# Patient Record
Sex: Male | Born: 1985 | Race: White | Hispanic: No | Marital: Married | Smoking: Current every day smoker
Health system: Southern US, Community
[De-identification: ages and names within clinical notes are randomized; demographics above are authoritative.]

## PROBLEM LIST (undated history)

## (undated) DIAGNOSIS — M5090 Cervical disc disorder, unspecified, unspecified cervical region: Secondary | ICD-10-CM

## (undated) HISTORY — PX: ADENOIDECTOMY: SUR15

## (undated) HISTORY — PX: TONSILLECTOMY AND ADENOIDECTOMY: SUR1326

## (undated) HISTORY — PX: TONSILLECTOMY: SUR1361

---

## 2008-05-01 ENCOUNTER — Emergency Department (HOSPITAL_BASED_OUTPATIENT_CLINIC_OR_DEPARTMENT_OTHER): Admission: EM | Admit: 2008-05-01 | Discharge: 2008-05-01 | Payer: Self-pay | Admitting: Emergency Medicine

## 2011-04-28 LAB — CBC
MCHC: 34
Platelets: 306
RDW: 12.4
WBC: 9.5

## 2011-04-28 LAB — BASIC METABOLIC PANEL
BUN: 10
CO2: 25
Calcium: 9
GFR calc non Af Amer: >60
Sodium: 143

## 2011-04-28 LAB — POCT TOXICOLOGY PANEL

## 2011-04-28 LAB — DIFFERENTIAL
Basophils Relative: 1
Eosinophils Absolute: 0.4
Eosinophils Relative: 5
Lymphocytes Relative: 32
Monocytes Relative: 6
Neutrophils Relative %: 56

## 2011-04-28 LAB — URINALYSIS, ROUTINE W REFLEX MICROSCOPIC
Glucose, UA: NEGATIVE
Nitrite: NEGATIVE
Protein, ur: NEGATIVE
Specific Gravity, Urine: 1.006
Urobilinogen, UA: 0.2
pH: 6

## 2011-11-28 ENCOUNTER — Encounter (HOSPITAL_BASED_OUTPATIENT_CLINIC_OR_DEPARTMENT_OTHER): Payer: Self-pay | Admitting: *Deleted

## 2011-11-28 ENCOUNTER — Emergency Department (INDEPENDENT_AMBULATORY_CARE_PROVIDER_SITE_OTHER): Payer: Self-pay

## 2011-11-28 ENCOUNTER — Emergency Department (HOSPITAL_BASED_OUTPATIENT_CLINIC_OR_DEPARTMENT_OTHER)
Admission: EM | Admit: 2011-11-28 | Discharge: 2011-11-28 | Disposition: A | Discharge status: Home / Self Care | Payer: Self-pay | Attending: Emergency Medicine | Admitting: Emergency Medicine

## 2011-11-28 DIAGNOSIS — S0100XA Unspecified open wound of scalp, initial encounter: Secondary | ICD-10-CM | POA: Insufficient documentation

## 2011-11-28 DIAGNOSIS — S0180XA Unspecified open wound of other part of head, initial encounter: Secondary | ICD-10-CM

## 2011-11-28 DIAGNOSIS — R51 Headache: Secondary | ICD-10-CM

## 2011-11-28 DIAGNOSIS — S0101XA Laceration without foreign body of scalp, initial encounter: Secondary | ICD-10-CM

## 2011-11-28 DIAGNOSIS — S01309A Unspecified open wound of unspecified ear, initial encounter: Secondary | ICD-10-CM

## 2011-11-28 DIAGNOSIS — Y9229 Other specified public building as the place of occurrence of the external cause: Secondary | ICD-10-CM | POA: Insufficient documentation

## 2011-11-28 DIAGNOSIS — S0990XA Unspecified injury of head, initial encounter: Secondary | ICD-10-CM | POA: Insufficient documentation

## 2011-11-28 DIAGNOSIS — S01312A Laceration without foreign body of left ear, initial encounter: Secondary | ICD-10-CM

## 2011-11-28 MED ORDER — LIDOCAINE HCL 2 % IJ SOLN
20.0000 mL | Freq: Once | INTRAMUSCULAR | Status: AC
  Administered 2011-11-28: 400 mg via INTRADERMAL

## 2011-11-28 MED ORDER — LIDOCAINE HCL 2 % IJ SOLN
INTRAMUSCULAR | Status: AC
  Filled 2011-11-28: qty 1

## 2011-11-28 MED ORDER — LIDOCAINE-EPINEPHRINE 2 %-1:100000 IJ SOLN
INTRAMUSCULAR | Status: AC
  Filled 2011-11-28: qty 1
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
## 2011-11-28 NOTE — ED Notes (Signed)
Dr. Bednar at bedside. 

## 2012-05-25 ENCOUNTER — Emergency Department (HOSPITAL_BASED_OUTPATIENT_CLINIC_OR_DEPARTMENT_OTHER)
Admission: EM | Admit: 2012-05-25 | Discharge: 2012-05-25 | Disposition: A | Discharge status: Home / Self Care | Payer: No Typology Code available for payment source | Attending: Emergency Medicine | Admitting: Emergency Medicine

## 2012-05-25 ENCOUNTER — Encounter (HOSPITAL_BASED_OUTPATIENT_CLINIC_OR_DEPARTMENT_OTHER): Payer: Self-pay | Admitting: *Deleted

## 2012-05-25 ENCOUNTER — Emergency Department (HOSPITAL_BASED_OUTPATIENT_CLINIC_OR_DEPARTMENT_OTHER): Payer: No Typology Code available for payment source

## 2012-05-25 DIAGNOSIS — Y9389 Activity, other specified: Secondary | ICD-10-CM | POA: Insufficient documentation

## 2012-05-25 DIAGNOSIS — IMO0002 Reserved for concepts with insufficient information to code with codable children: Secondary | ICD-10-CM | POA: Insufficient documentation

## 2012-05-25 DIAGNOSIS — Y9289 Other specified places as the place of occurrence of the external cause: Secondary | ICD-10-CM | POA: Insufficient documentation

## 2012-05-25 DIAGNOSIS — S46919A Strain of unspecified muscle, fascia and tendon at shoulder and upper arm level, unspecified arm, initial encounter: Secondary | ICD-10-CM

## 2012-05-25 DIAGNOSIS — F17200 Nicotine dependence, unspecified, uncomplicated: Secondary | ICD-10-CM | POA: Insufficient documentation

## 2012-05-25 MED ORDER — HYDROCODONE-ACETAMINOPHEN 5-500 MG PO TABS: 1.0000 | ORAL_TABLET | Freq: Three times a day (TID) | ORAL | Status: DC | PRN
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
## 2012-05-25 NOTE — ED Notes (Signed)
Patient transported to X-ray 
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
## 2014-01-01 ENCOUNTER — Emergency Department (HOSPITAL_BASED_OUTPATIENT_CLINIC_OR_DEPARTMENT_OTHER)
Admission: EM | Admit: 2014-01-01 | Discharge: 2014-01-01 | Disposition: A | Discharge status: Home / Self Care | Payer: No Typology Code available for payment source | Attending: Emergency Medicine | Admitting: Emergency Medicine

## 2014-01-01 ENCOUNTER — Encounter (HOSPITAL_BASED_OUTPATIENT_CLINIC_OR_DEPARTMENT_OTHER): Payer: Self-pay | Admitting: Emergency Medicine

## 2014-01-01 DIAGNOSIS — F17200 Nicotine dependence, unspecified, uncomplicated: Secondary | ICD-10-CM | POA: Insufficient documentation

## 2014-01-01 DIAGNOSIS — K029 Dental caries, unspecified: Secondary | ICD-10-CM

## 2014-01-01 DIAGNOSIS — K089 Disorder of teeth and supporting structures, unspecified: Secondary | ICD-10-CM | POA: Insufficient documentation

## 2014-01-01 DIAGNOSIS — K0381 Cracked tooth: Secondary | ICD-10-CM | POA: Insufficient documentation

## 2014-01-01 DIAGNOSIS — S025XXA Fracture of tooth (traumatic), initial encounter for closed fracture: Secondary | ICD-10-CM

## 2014-01-01 MED ORDER — IBUPROFEN 800 MG PO TABS: 800.0000 mg | ORAL_TABLET | Freq: Three times a day (TID) | ORAL | Status: DC

## 2014-01-01 MED ORDER — AMOXICILLIN 500 MG PO CAPS: 500.0000 mg | ORAL_CAPSULE | Freq: Three times a day (TID) | ORAL | Status: DC

## 2014-01-01 MED ORDER — HYDROCODONE-ACETAMINOPHEN 5-325 MG PO TABS: 1.0000 | ORAL_TABLET | Freq: Once | ORAL | Status: DC

## 2014-01-01 MED ORDER — HYDROCODONE-ACETAMINOPHEN 5-325 MG PO TABS: 2.0000 | ORAL_TABLET | ORAL | Status: DC | PRN

## 2014-01-01 MED ORDER — AMOXICILLIN 500 MG PO CAPS: 500.0000 mg | ORAL_CAPSULE | Freq: Once | ORAL | Status: DC

## 2014-01-01 NOTE — ED Provider Notes (Signed)
CSN: [id number]     Arrival date & time 01/01/14  1619 History   First MD Initiated Contact with Patient 01/01/14 1744     Chief Complaint  Patient presents with  . Dental Pain     (Consider location/radiation/quality/duration/timing/severity/associated sxs/prior Treatment) Patient is a 28 y.o. male presenting with tooth pain. The history is provided by the patient.  Dental Pain Location:  Lower Lower teeth location:  31/RL 2nd molar Quality:  Throbbing and constant Severity:  Severe Onset quality:  Gradual Duration:  3 days Timing:  Constant Progression:  Worsening Chronicity:  New Context: dental caries and dental fracture   Relieved by:  Nothing Worsened by:  Pressure, jaw movement and cold food/drink Ineffective treatments:  Acetaminophen  Yehuda SavannahJeffrey Natt is a 28 y.o. male who presents to the ED with dental pain. The pain is located in the lower right second molar. The tooth was decayed and while eating 3 days ago the tooth broke.   History reviewed. No pertinent past medical history. Past Surgical History  Procedure Laterality Date  . Tonsillectomy    . Adenoidectomy     No family history on file. History  Substance Use Topics  . Smoking status: Current Every Day Smoker -- 0.50 packs/day  . Smokeless tobacco: Not on file  . Alcohol Use: No    Review of Systems Negative except as stated in HPI   Allergies  Septra  Home Medications   Prior to Admission medications   Medication Sig Start Date End Date Taking? Authorizing Provider  HYDROcodone-acetaminophen (VICODIN) 5-500 MG per tablet Take 1 tablet by mouth every 8 (eight) hours as needed for pain. 05/25/12   Joya Gaskinsonald W Wickline, MD   BP 121/75  Pulse 115  Temp(Src) 98.3 F (36.8 C) (Oral)  Resp 20  Ht 6' (1.829 m)  Wt 175 lb (79.379 kg)  BMI 23.73 kg/m2  SpO2 93% Physical Exam  Nursing note and vitals reviewed. Constitutional: He is oriented to person, place, and time. He appears well-developed and  well-nourished. No distress.  HENT:  Head: Normocephalic.  Mouth/Throat: Uvula is midline, oropharynx is clear and moist and mucous membranes are normal.    Right lower second molar is broken and decayed at the gum line. Tender on palpation.   Eyes: EOM are normal.  Neck: Neck supple.  Pulmonary/Chest: Effort normal.  Abdominal: Soft. There is no tenderness.  Musculoskeletal: Normal range of motion.  Neurological: He is alert and oriented to person, place, and time. No cranial nerve deficit.  Skin: Skin is warm and dry.    ED Course  Procedures ( MDM  28 y.o. male with dental pain after his tooth broke 3 days ago. The tooth is decayed. Will treat for infection and pain. He will follow up with oral surgery. He will return here as needed.  Discussed with the patient and all questioned fully answered.    Medication List    STOP taking these medications       HYDROcodone-acetaminophen 5-500 MG per tablet  Commonly known as:  VICODIN  Replaced by:  HYDROcodone-acetaminophen 5-325 MG per tablet      TAKE these medications       amoxicillin 500 MG capsule  Commonly known as:  AMOXIL  Take 1 capsule (500 mg total) by mouth 3 (three) times daily.     HYDROcodone-acetaminophen 5-325 MG per tablet  Commonly known as:  NORCO/VICODIN  Take 2 tablets by mouth every 4 (four) hours as needed.  ibuprofen 800 MG tablet  Commonly known as:  ADVIL,MOTRIN  Take 1 tablet (800 mg total) by mouth 3 (three) times daily.          [facility] Orlene Och, [place] 01/02/14 0127

## 2014-01-01 NOTE — ED Notes (Signed)
Dental pain x 3 days. His tooth broke.

## 2014-01-04 NOTE — ED Provider Notes (Signed)
Medical screening examination/treatment/procedure(s) were performed by non-physician practitioner and as supervising physician I was immediately available for consultation/collaboration.   EKG Interpretation None        Layla Maw Ward, DO 01/04/14 0383

## 2014-08-02 ENCOUNTER — Emergency Department (HOSPITAL_BASED_OUTPATIENT_CLINIC_OR_DEPARTMENT_OTHER): Payer: Self-pay

## 2014-08-02 ENCOUNTER — Encounter (HOSPITAL_BASED_OUTPATIENT_CLINIC_OR_DEPARTMENT_OTHER): Payer: Self-pay

## 2014-08-02 ENCOUNTER — Emergency Department (HOSPITAL_BASED_OUTPATIENT_CLINIC_OR_DEPARTMENT_OTHER)
Admission: EM | Admit: 2014-08-02 | Discharge: 2014-08-03 | Disposition: A | Discharge status: Home / Self Care | Payer: Self-pay | Attending: Emergency Medicine | Admitting: Emergency Medicine

## 2014-08-02 DIAGNOSIS — R05 Cough: Secondary | ICD-10-CM

## 2014-08-02 DIAGNOSIS — Z720 Tobacco use: Secondary | ICD-10-CM | POA: Insufficient documentation

## 2014-08-02 DIAGNOSIS — J069 Acute upper respiratory infection, unspecified: Secondary | ICD-10-CM | POA: Insufficient documentation

## 2014-08-02 DIAGNOSIS — J209 Acute bronchitis, unspecified: Secondary | ICD-10-CM | POA: Insufficient documentation

## 2014-08-02 DIAGNOSIS — J40 Bronchitis, not specified as acute or chronic: Secondary | ICD-10-CM

## 2014-08-02 DIAGNOSIS — R059 Cough, unspecified: Secondary | ICD-10-CM

## 2014-08-02 DIAGNOSIS — M791 Myalgia: Secondary | ICD-10-CM | POA: Insufficient documentation

## 2014-08-02 MED ORDER — SODIUM CHLORIDE 0.9 % IV BOLUS (SEPSIS)
1000.0000 mL | Freq: Once | INTRAVENOUS | Status: AC
  Administered 2014-08-02: 1000 mL via INTRAVENOUS

## 2014-08-02 MED ORDER — KETOROLAC TROMETHAMINE 30 MG/ML IJ SOLN
30.0000 mg | Freq: Once | INTRAMUSCULAR | Status: AC
  Administered 2014-08-02: 30 mg via INTRAVENOUS
  Filled 2014-08-02: qty 1

## 2014-08-02 NOTE — ED Provider Notes (Signed)
CSN: [id number]     Arrival date & time 08/02/14  2226 History  This chart was scribed for Shakiyla Kook Smitty CordsK Asaf Elmquist-Rasch, MD by Roxy Cedarhandni Bhalodia, ED Scribe. This patient was seen in room MH11/MH11 and the patient's care was started at 11:18 PM.   Chief Complaint  Patient presents with  . URI   Patient is a 29 y.o. male presenting with URI. The history is provided by the patient. No language interpreter was used.  URI Presenting symptoms: congestion, cough and rhinorrhea   Presenting symptoms: no fever and no sore throat   Severity:  Moderate Onset quality:  Gradual Duration:  1 week Timing:  Constant Progression:  Worsening Chronicity:  New Relieved by:  Nothing Worsened by:  Nothing tried Ineffective treatments:  None tried Associated symptoms: myalgias   Associated symptoms: no headaches and no neck pain    HPI Comments: Dale Hill is a 29 y.o. male who presents to the Emergency Department complaining of productive cough with yellow and green sputum, body aches, nasal congestion, wheezing, rhinorrhea for 7 days. Patient states that he has been taking Mucinex. He reports intermittent episodes of diarrhea. States he travel by plan to [place] in October but denies leg or chest pain. Patient is a smoker. Patient states he ran out of his inhaler. Patient states that the cough worsened in the past 2 days.  No fevers no rashes on the skin.    History reviewed. No pertinent past medical history. Past Surgical History  Procedure Laterality Date  . Tonsillectomy    . Adenoidectomy     No family history on file. History  Substance Use Topics  . Smoking status: Current Every Day Smoker -- 0.00 packs/day  . Smokeless tobacco: Not on file  . Alcohol Use: Yes   Review of Systems  Constitutional: Negative for fever.  HENT: Positive for congestion and rhinorrhea. Negative for sore throat and trouble swallowing.   Eyes: Negative for photophobia.  Respiratory: Positive for cough.   Musculoskeletal:  Positive for myalgias. Negative for neck pain and neck stiffness.  Neurological: Negative for headaches.  All other systems reviewed and are negative.  Allergies  Septra  Home Medications   Prior to Admission medications   Medication Sig Start Date End Date Taking? Authorizing Provider  Pseudoephedrine-Guaifenesin [facility](MUCINEX D PO) Take by mouth.   Yes Historical Provider, MD   Triage Vitals: BP 133/85 mmHg  Pulse 134  Temp(Src) 98.3 F (36.8 C) (Oral)  Resp 18  Ht 6' (1.829 m)  Wt 190 lb (86.183 kg)  BMI 25.76 kg/m2  SpO2 94%  Physical Exam  Constitutional: He is oriented to person, place, and time. He appears well-developed and well-nourished. No distress.     HENT:  Head: Normocephalic and atraumatic.  Right Ear: External ear normal.  Left Ear: External ear normal.  Mouth/Throat: Oropharynx is clear and moist.  Eyes: Conjunctivae and EOM are normal. Pupils are equal, round, and reactive to light.  Neck: Normal range of motion. Neck supple.  No meningismus  Cardiovascular: Normal rate, regular rhythm and normal heart sounds.   Pulmonary/Chest: Effort normal and breath sounds normal. No respiratory distress. He has no wheezes. He has no rales.  Abdominal: Soft. Bowel sounds are normal. There is no tenderness. There is no rebound and no guarding.  Musculoskeletal: Normal range of motion. He exhibits no edema or tenderness.  Lymphadenopathy:    He has no cervical adenopathy.  Neurological: He is alert and oriented to person, place, and time. He  has normal reflexes. No cranial nerve deficit. He exhibits normal muscle tone. Coordination normal.  Skin: Skin is warm and dry. No rash noted.  Psychiatric: He has a normal mood and affect. His behavior is normal.  Nursing note and vitals reviewed.  ED Course  Procedures (including critical care time)  DIAGNOSTIC STUDIES: Oxygen Saturation is 94% on RA, normal by my interpretation.    COORDINATION OF CARE: 11:51 PM- Discussed  plans to order diagnostic CXR, lab work and urinalysis. Will give patient IV fluids and Toradol. Pt advised of plan for treatment and pt agrees.  Labs Review Labs Reviewed  RESPIRATORY VIRUS PANEL  URINALYSIS, ROUTINE W REFLEX MICROSCOPIC   Imaging Review No results found.   EKG Interpretation None     MDM   Final diagnoses:  None    Given persistent tachycardia without fever and recent travel ruled out for PE. Patient denied all substances but urine is positive for THC and amphetamines.  On previous visits patient HR never < 100.  There is no neck stiffness no fever and symptoms > 7 days.  No indication for advanced work up or LP.  Will treat with naproxen and doxycycline for bronchitis as patient is a smoker.  Stop all substances and follow up with your PMD for recheck  I personally performed the services described in this documentation, which was scribed in my presence. The recorded information has been reviewed and is accurate.  Jasmine Awe, MD 08/03/14 [phone]

## 2014-08-02 NOTE — ED Notes (Signed)
Pt states he's been coughing up large amounts of sputum. Has stiffness in the neck. Pt is using a tennis ball to massage a knot out of his neck/back. Pt a/o x4

## 2014-08-02 NOTE — ED Notes (Signed)
C/o head/chest congestion x 1 week

## 2014-08-03 ENCOUNTER — Encounter (HOSPITAL_BASED_OUTPATIENT_CLINIC_OR_DEPARTMENT_OTHER): Payer: Self-pay

## 2014-08-03 ENCOUNTER — Emergency Department (HOSPITAL_BASED_OUTPATIENT_CLINIC_OR_DEPARTMENT_OTHER): Payer: Self-pay

## 2014-08-03 LAB — URINALYSIS, ROUTINE W REFLEX MICROSCOPIC
Bilirubin Urine: NEGATIVE
Glucose, UA: NEGATIVE mg/dL
Hgb urine dipstick: NEGATIVE
Ketones, ur: NEGATIVE mg/dL
LEUKOCYTES UA: NEGATIVE
Nitrite: NEGATIVE
PH: 7.5 (ref 5.0–8.0)
Protein, ur: NEGATIVE mg/dL
SPECIFIC GRAVITY, URINE: 1.008 (ref 1.005–1.030)
UROBILINOGEN UA: 0.2 mg/dL (ref 0.0–1.0)

## 2014-08-03 LAB — RAPID URINE DRUG SCREEN, HOSP PERFORMED
AMPHETAMINES: POSITIVE — AB
BENZODIAZEPINES: NOT DETECTED
Barbiturates: NOT DETECTED
COCAINE: NOT DETECTED
Opiates: NOT DETECTED
Tetrahydrocannabinol: POSITIVE — AB

## 2014-08-03 LAB — BASIC METABOLIC PANEL
ANION GAP: 8 (ref 5–15)
BUN: 7 mg/dL (ref 6–23)
CHLORIDE: 101 meq/L (ref 96–112)
CO2: 29 mmol/L (ref 19–32)
CREATININE: 0.84 mg/dL (ref 0.50–1.35)
Calcium: 9.3 mg/dL (ref 8.4–10.5)
GFR calc Af Amer: >90 mL/min (ref >90)
GFR calc non Af Amer: >90 mL/min (ref >90)
Glucose, Bld: 121 mg/dL — ABNORMAL HIGH (ref 70–99)
POTASSIUM: 3.5 mmol/L (ref 3.5–5.1)
Sodium: 138 mmol/L (ref 135–145)

## 2014-08-03 LAB — CBC WITH DIFFERENTIAL/PLATELET
BASOS ABS: 0 10*3/uL (ref 0.0–0.1)
BASOS PCT: 1 % (ref 0–1)
EOS ABS: 0.3 10*3/uL (ref 0.0–0.7)
EOS PCT: 4 % (ref 0–5)
HCT: 48.6 % (ref 39.0–52.0)
HEMOGLOBIN: 16.6 g/dL (ref 13.0–17.0)
Lymphocytes Relative: 20 % (ref 12–46)
Lymphs Abs: 1.7 10*3/uL (ref 0.7–4.0)
MCH: 31.1 pg (ref 26.0–34.0)
MCHC: 34.2 g/dL (ref 30.0–36.0)
MCV: 91.2 fL (ref 78.0–100.0)
MONOS PCT: 6 % (ref 3–12)
Monocytes Absolute: 0.5 10*3/uL (ref 0.1–1.0)
NEUTROS ABS: 6 10*3/uL (ref 1.7–7.7)
Neutrophils Relative %: 71 % (ref 43–77)
Platelets: 282 10*3/uL (ref 150–400)
RBC: 5.33 MIL/uL (ref 4.22–5.81)
RDW: 12.3 % (ref 11.5–15.5)
WBC: 8.6 10*3/uL (ref 4.0–10.5)

## 2014-08-03 MED ORDER — DOXYCYCLINE HYCLATE 100 MG PO CAPS: 100.0000 mg | ORAL_CAPSULE | Freq: Two times a day (BID) | ORAL | Status: DC

## 2014-08-03 MED ORDER — SODIUM CHLORIDE 0.9 % IV BOLUS (SEPSIS)
1000.0000 mL | Freq: Once | INTRAVENOUS | Status: AC
  Administered 2014-08-03: 1000 mL via INTRAVENOUS

## 2014-08-03 MED ORDER — IOHEXOL 350 MG/ML SOLN
100.0000 mL | Freq: Once | INTRAVENOUS | Status: AC | PRN
  Administered 2014-08-03: 100 mL via INTRAVENOUS

## 2014-08-03 MED ORDER — DOXYCYCLINE HYCLATE 100 MG PO TABS
100.0000 mg | ORAL_TABLET | Freq: Once | ORAL | Status: AC
  Administered 2014-08-03: 100 mg via ORAL
  Filled 2014-08-03: qty 1

## 2014-08-03 MED ORDER — ALBUTEROL SULFATE HFA 108 (90 BASE) MCG/ACT IN AERS: 1.0000 | INHALATION_SPRAY | Freq: Four times a day (QID) | RESPIRATORY_TRACT | Status: DC | PRN

## 2014-08-03 NOTE — ED Notes (Signed)
Patient transported to X-ray 

## 2014-12-08 ENCOUNTER — Emergency Department (HOSPITAL_BASED_OUTPATIENT_CLINIC_OR_DEPARTMENT_OTHER)
Admission: EM | Admit: 2014-12-08 | Discharge: 2014-12-08 | Disposition: A | Discharge status: Home / Self Care | Payer: Self-pay | Attending: Emergency Medicine | Admitting: Emergency Medicine

## 2014-12-08 ENCOUNTER — Encounter (HOSPITAL_BASED_OUTPATIENT_CLINIC_OR_DEPARTMENT_OTHER): Payer: Self-pay

## 2014-12-08 DIAGNOSIS — Z720 Tobacco use: Secondary | ICD-10-CM | POA: Insufficient documentation

## 2014-12-08 DIAGNOSIS — L03311 Cellulitis of abdominal wall: Secondary | ICD-10-CM | POA: Insufficient documentation

## 2014-12-08 DIAGNOSIS — Z792 Long term (current) use of antibiotics: Secondary | ICD-10-CM | POA: Insufficient documentation

## 2014-12-08 DIAGNOSIS — L03116 Cellulitis of left lower limb: Secondary | ICD-10-CM | POA: Insufficient documentation

## 2014-12-08 DIAGNOSIS — L03818 Cellulitis of other sites: Secondary | ICD-10-CM

## 2014-12-08 MED ORDER — CLINDAMYCIN HCL 150 MG PO CAPS
300.0000 mg | ORAL_CAPSULE | Freq: Once | ORAL | Status: AC
  Administered 2014-12-08: 300 mg via ORAL
  Filled 2014-12-08: qty 2

## 2014-12-08 MED ORDER — CLINDAMYCIN HCL 300 MG PO CAPS: 300.0000 mg | ORAL_CAPSULE | Freq: Three times a day (TID) | ORAL | Status: DC

## 2014-12-08 NOTE — Discharge Instructions (Signed)
You have a skin infection, likely MRSA.  Take clinda 300 mg three times daily for a week.   Follow up with your doctor.   Return to ER if you have fever, worse rash, purulent drainage from the wound.

## 2014-12-08 NOTE — ED Provider Notes (Signed)
CSN: [id number]     Arrival date & time 12/08/14  1439 History  This chart was scribed for Dale Canalavid H Yao, MD by Abel PrestoKara Demonbreun, ED Scribe. This patient was seen in room MH03/MH03 and the patient's care was started at 3:42 PM.    Chief Complaint  Patient presents with  . Insect Bite     The history is provided by the patient. No language interpreter was used.   HPI Comments: Dale Hill is a 29 y.o. male otherwise healthy who presents to the Emergency Department complaining of 2 areas of redness to left shin and left abdomen. Pt states he as working in yard at onset. He is unsure if he was bitten by and insect. Pt notes associated tenderness and mild drainage. Pt denies h/o abscess, rash, and MRSA. No fever but brother has hx of MRSA. Doesn't use IV drugs. Pt is allergic to Septra. Pt denies rash in any other areas, fever, nausea, vomiting, and itching.   History reviewed. No pertinent past medical history. Past Surgical History  Procedure Laterality Date  . Tonsillectomy    . Adenoidectomy     No family history on file. History  Substance Use Topics  . Smoking status: Current Every Day Smoker -- 0.00 packs/day    Types: Cigarettes  . Smokeless tobacco: Not on file  . Alcohol Use: Yes    Review of Systems  Constitutional: Negative for fever.  Gastrointestinal: Negative for nausea and vomiting.  Skin: Positive for rash.     Allergies  Septra  Home Medications   Prior to Admission medications   Medication Sig Start Date End Date Taking? Authorizing Provider  albuterol (PROVENTIL HFA;VENTOLIN HFA) 108 (90 BASE) MCG/ACT inhaler Inhale 1-2 puffs into the lungs every 6 (six) hours as needed for wheezing or shortness of breath. 08/03/14   April Palumbo, MD  doxycycline (VIBRAMYCIN) 100 MG capsule Take 1 capsule (100 mg total) by mouth 2 (two) times daily. One po bid x 7 days 08/03/14   April Palumbo, MD  Pseudoephedrine-Guaifenesin [facility](MUCINEX D PO) Take by mouth.    Historical  Provider, MD   BP 127/77 mmHg  Temp(Src) 98.4 F (36.9 C) (Oral)  Resp 18  Ht 6' (1.829 m)  Wt 175 lb (79.379 kg)  BMI 23.73 kg/m2  SpO2 98% Physical Exam  Constitutional: He is oriented to person, place, and time. He appears well-developed and well-nourished.  HENT:  Head: Normocephalic and atraumatic.  Eyes: Conjunctivae and EOM are normal.  Neck: Normal range of motion. Neck supple.  Cardiovascular: Normal rate, regular rhythm, normal heart sounds and intact distal pulses.   Pulmonary/Chest: Effort normal and breath sounds normal. No respiratory distress.  Abdominal: Soft. He exhibits no distension. There is no tenderness.  Musculoskeletal: Normal range of motion.  Neurological: He is alert and oriented to person, place, and time.  Skin: Skin is warm and dry.  Cellulitis on abdomen and L shin, no evidence of abscess or purulent discharge.   Psychiatric: He has a normal mood and affect. His behavior is normal. Judgment normal.  Nursing note and vitals reviewed.   ED Course  Procedures (including critical care time) DIAGNOSTIC STUDIES: Oxygen Saturation is 98% on room air, normal by my interpretation.    COORDINATION OF CARE: 3:45 PM Discussed treatment plan with patient at beside, the patient agrees with the plan and has no further questions at this time.   Labs Review Labs Reviewed - No data to display  Imaging Review No results found.  EKG Interpretation None      MDM   Final diagnoses:  None   Dale Hill is a 29 y.o. male here with rash. Likely cellulitis, no evidence of abscess. Consider MRSA. Allergic to bactrim. Will dc on clinda.   I personally performed the services described in this documentation, which was scribed in my presence. The recorded information has been reviewed and is accurate.   Dale Canalavid H Yao, MD 12/08/14 [phone]

## 2014-12-08 NOTE — ED Notes (Signed)
Pt reports was outside landscaping yesterday and got bitten in L anterior shin and abdomen, ?bites with erythema surrounding on each.

## 2014-12-08 NOTE — ED Notes (Signed)
No other areas noted on body, pt also denies any other areas as well.

## 2014-12-08 NOTE — ED Notes (Signed)
Presents with two red areas, first site: on left ant leg, tibial area, noted to be slightly swollen with sm amt of drainage. Second site: on abd, left of umbilicus, area also noted to be red, min amt of drainage noted at this site as well. Pt states has pain upon touching sites. Denies any fever, nausea or vomiting.

## 2015-02-01 ENCOUNTER — Emergency Department (HOSPITAL_BASED_OUTPATIENT_CLINIC_OR_DEPARTMENT_OTHER)
Admission: EM | Admit: 2015-02-01 | Discharge: 2015-02-01 | Disposition: A | Discharge status: Home / Self Care | Payer: Self-pay | Attending: Emergency Medicine | Admitting: Emergency Medicine

## 2015-02-01 ENCOUNTER — Encounter (HOSPITAL_BASED_OUTPATIENT_CLINIC_OR_DEPARTMENT_OTHER): Payer: Self-pay

## 2015-02-01 ENCOUNTER — Emergency Department (HOSPITAL_BASED_OUTPATIENT_CLINIC_OR_DEPARTMENT_OTHER): Payer: Self-pay

## 2015-02-01 DIAGNOSIS — Z720 Tobacco use: Secondary | ICD-10-CM | POA: Insufficient documentation

## 2015-02-01 DIAGNOSIS — R52 Pain, unspecified: Secondary | ICD-10-CM

## 2015-02-01 DIAGNOSIS — N492 Inflammatory disorders of scrotum: Secondary | ICD-10-CM | POA: Insufficient documentation

## 2015-02-01 LAB — BASIC METABOLIC PANEL
ANION GAP: 11 (ref 5–15)
BUN: 18 mg/dL (ref 6–20)
CO2: 27 mmol/L (ref 22–32)
CREATININE: 1.09 mg/dL (ref 0.61–1.24)
Calcium: 9.6 mg/dL (ref 8.9–10.3)
Chloride: 100 mmol/L — ABNORMAL LOW (ref 101–111)
GFR calc Af Amer: >60 mL/min (ref >60)
GFR calc non Af Amer: >60 mL/min (ref >60)
Glucose, Bld: 126 mg/dL — ABNORMAL HIGH (ref 65–99)
POTASSIUM: 3.1 mmol/L — AB (ref 3.5–5.1)
SODIUM: 138 mmol/L (ref 135–145)

## 2015-02-01 LAB — URINALYSIS, ROUTINE W REFLEX MICROSCOPIC
GLUCOSE, UA: NEGATIVE mg/dL
HGB URINE DIPSTICK: NEGATIVE
KETONES UR: 15 mg/dL — AB
Leukocytes, UA: NEGATIVE
Nitrite: NEGATIVE
Protein, ur: NEGATIVE mg/dL
Specific Gravity, Urine: 1.041 — ABNORMAL HIGH (ref 1.005–1.030)
Urobilinogen, UA: 1 mg/dL (ref 0.0–1.0)
pH: 5.5 (ref 5.0–8.0)

## 2015-02-01 LAB — CBC
HCT: 45.4 % (ref 39.0–52.0)
Hemoglobin: 15.3 g/dL (ref 13.0–17.0)
MCH: 30.5 pg (ref 26.0–34.0)
MCHC: 33.7 g/dL (ref 30.0–36.0)
MCV: 90.4 fL (ref 78.0–100.0)
PLATELETS: 246 10*3/uL (ref 150–400)
RBC: 5.02 MIL/uL (ref 4.22–5.81)
RDW: 13 % (ref 11.5–15.5)
WBC: 12.2 10*3/uL — ABNORMAL HIGH (ref 4.0–10.5)

## 2015-02-01 MED ORDER — HYDROCODONE-ACETAMINOPHEN 5-325 MG PO TABS
2.0000 | ORAL_TABLET | Freq: Once | ORAL | Status: AC
  Administered 2015-02-01: 2 via ORAL
  Filled 2015-02-01: qty 2

## 2015-02-01 MED ORDER — DOXYCYCLINE HYCLATE 100 MG PO CAPS: 100.0000 mg | ORAL_CAPSULE | Freq: Two times a day (BID) | ORAL | Status: DC

## 2015-02-01 NOTE — Discharge Instructions (Signed)
Cellulitis °Cellulitis is an infection of the skin and the tissue beneath it. The infected area is usually red and tender. Cellulitis occurs most often in the arms and lower legs.  °CAUSES  °Cellulitis is caused by bacteria that enter the skin through cracks or cuts in the skin. The most common types of bacteria that cause cellulitis are staphylococci and streptococci. °SIGNS AND SYMPTOMS  °· Redness and warmth. °· Swelling. °· Tenderness or pain. °· Fever. °DIAGNOSIS  °Your health care provider can usually determine what is wrong based on a physical exam. Blood tests may also be done. °TREATMENT  °Treatment usually involves taking an antibiotic medicine. °HOME CARE INSTRUCTIONS  °· Take your antibiotic medicine as directed by your health care provider. Finish the antibiotic even if you start to feel better. °· Keep the infected arm or leg elevated to reduce swelling. °· Apply a warm cloth to the affected area up to 4 times per day to relieve pain. °· Take medicines only as directed by your health care provider. °· Keep all follow-up visits as directed by your health care provider. °SEEK MEDICAL CARE IF:  °· You notice red streaks coming from the infected area. °· Your red area gets larger or turns dark in color. °· Your bone or joint underneath the infected area becomes painful after the skin has healed. °· Your infection returns in the same area or another area. °· You notice a swollen bump in the infected area. °· You develop new symptoms. °· You have a fever. °SEEK IMMEDIATE MEDICAL CARE IF:  °· You feel very sleepy. °· You develop vomiting or diarrhea. °· You have a general ill feeling (malaise) with muscle aches and pains. °MAKE SURE YOU:  °· Understand these instructions. °· Will watch your condition. °· Will get help right away if you are not doing well or get worse. °Document Released: 04/22/2005 Document Revised: 11/27/2013 Document Reviewed: 09/28/2011 °ExitCare® Patient Information ©2015 [employer].  This information is not intended to replace advice given to you by your health care provider. Make sure you discuss any questions you have with your health care provider. ° ° °Emergency Department Resource Guide °1) Find a Doctor and Pay Out of Pocket °Although you won't have to find out who is covered by your insurance plan, it is a good idea to ask around and get recommendations. You will then need to call the office and see if the doctor you have chosen will accept you as a new patient and what types of options they offer for patients who are self-pay. Some doctors offer discounts or will set up payment plans for their patients who do not have insurance, but you will need to ask so you aren't surprised when you get to your appointment. ° °2) Contact Your Local Health Department °Not all health departments have doctors that can see patients for sick visits, but many do, so it is worth a call to see if yours does. If you don't know where your local health department is, you can check in your phone book. The CDC also has a tool to help you locate your state's health department, and many state websites also have listings of all of their local health departments. ° °3) Find a Walk-in Clinic °If your illness is not likely to be very severe or complicated, you may want to try a walk in clinic. These are popping up all over the country in pharmacies, drugstores, and shopping centers. They're usually staffed by nurse practitioners   or physician assistants that have been trained to treat common illnesses and complaints. They're usually fairly quick and inexpensive. However, if you have serious medical issues or chronic medical problems, these are probably not your best option. ° °No Primary Care Doctor: °- Call Health Connect at  832-8000 - they can help you locate a primary care doctor that  accepts your insurance, provides certain services, etc. °- Physician Referral Service- 1-800-533-3463 ° °Chronic Pain  Problems: °Organization         Address  Phone   Notes  °[facility]  (336) 297-2271 Patients need to be referred by their primary care doctor.  ° °Medication Assistance: °Organization         Address  Phone   Notes  °[place] [facility] [address] °[place], [place] [address] (336) 641-8030 --Must be a resident of [place] °-- Must have NO insurance coverage whatsoever (no Medicaid/ Medicare, etc.) °-- The pt. MUST have a primary care doctor that directs their care regularly and follows them in the community °  °MedAssist  (866) 331-1348   °United Way  (888) 892-1162   ° °Agencies that provide inexpensive medical care: °Organization         Address  Phone   Notes  °[facility]  (336) 832-8035   °[facility]    (336) 832-7272   °[facility] [address] °[place], [place] [address] (336) 832-4777   °[facility] [address], °[place] (336) 271-4999   °[facility]    (336) 373-0678   °[facility]    (336) 272-1050   °[facility] ° [address], [place] Phone:  (336) 832-4444, Fax:  (336) 832-4440 Hours of Operation:  9 am - 6 pm, M-F.  Also accepts Medicaid/Medicare and self-pay.  °[facility] ° [address], [place] Phone: (336) [phone], Fax: (336) [phone]. Hours of Operation:  8:30 am - 5:30 pm, M-F.  Also accepts Medicaid and self-pay.  °[facility] [place] [address], [place] Phone: (336) 878-6027   °[facility] [address], [place], [place] (336)723-1848, Ext. 123 Mondays & Thursdays: 7-9 AM.  First 15 patients are seen on a first come, first serve basis. °  ° °Medicaid-accepting [place] Providers: ° °Organization         Address  Phone   Notes  °[facility] [address], [place] (336) 641-2100 Also  accepts self-pay patients.  °[facility] [address], [place] ° (336) 856-9996   °[facility] [address] [place] [address], [place] (336) 288-8857   °[facility] [address] [place] [address], [place] (336) 299-7000   °Veita Bland [address], [place]  ° (336) 373-1557 Only accepts [place] Access Medicaid patients after they have their name applied to their card.  ° °Self-Pay (no insurance) in [place]: ° °Organization         Address  Phone   Notes  °Sickle Cell Patients, [facility] [address], [place] (336) 832-1970   °[facility] [address],  (336) 832-4400   ° [facility] [place] ° [address], [place] (336) 992-4800   °[facility]/Dr. Osei-Bonsu ° [address]   [place] [address], [place] or [address], [place] (336) 841-8500 Phone number for both [place] and [place] locations is the same.  °[facility] [address], [place] (336) 299-0000   °[facility] [address] [place] [address], [place] or [address] (336) 852-7530 °(336) 878-2260   °[facility] [address], [place] (336) 350-1642, phone; (336) 294-5005, fax Sees patients 1st and 3rd Saturday of every month.  Must not qualify for public or private insurance (i.e. Medicaid, Medicare, [place] Health Choice, Veterans' Benefits) • Household income should be no more than 200% of the poverty level •The clinic cannot treat you if you are pregnant or think you are pregnant • Sexually transmitted diseases are not treated at the clinic.  ° ° °Dental Care: °Organization         Address  Phone  Notes  °[place] [facility] [address], [place] (336) 641-6152 Accepts children up to age 21 who are enrolled in Medicaid or [place] Health Choice; pregnant  women with a Medicaid card; and children who have applied for Medicaid or [place] Health Choice, but were declined, whose parents can pay a reduced fee at time of service.  °[place] [facility] [place]  [address], [place] (336) 641-7733 Accepts children up to age 21 who are enrolled in Medicaid or [place] Health Choice; pregnant women with a Medicaid card; and children who have applied for Medicaid or [place] Health Choice, but were declined, whose parents can pay a reduced fee at time of service.  °[facility] ° [address], [place] (336) 641-4533 Patients are seen by appointment only. Walk-ins are not accepted. [facility] will see patients 18 years of age and older. °Monday - Tuesday (8am-5pm) °Most Wednesdays (8:30-5pm) °$30 per visit, cash only  °[facility] ° [address], [place] (336) 641-4533 Patients are seen by appointment only. Walk-ins are not accepted. [facility] will see patients 18 years of age and older. °One Wednesday Evening (Monthly: Volunteer Based).  $30 per visit, cash only  °[facility]  (919) [phone] for adults; Children under age 4, call Graduate Pediatric Dentistry at ([phone]) [phone]. Children aged 4-14, please call ([phone]) [phone] to request a pediatric application. ° Dental services are provided in all areas of dental care including fillings, crowns and bridges, complete and partial dentures, implants, gum treatment, root canals, and extractions. Preventive care is also provided. Treatment is provided to both adults and children. °Patients are selected via a lottery and there is often a waiting list. °  °[facility] [address], °[place] ° (336) 763-8833 [email or web address] °  °[facility] [address], [place], [place] (336)723-1848, Ext. 123 Second and Fourth Thursday of each month, opens at 6:30 AM; Clinic ends at 9 AM.  Patients are  seen on a first-come first-served basis, and a limited number are seen during each clinic.  ° °[facility] ° [address] [place] [address], [place], [place] (336) 723-7904   Eligibility Requirements °You must have lived in [place], [place], or [place] counties for at least the last three months. °  You cannot be eligible for state or federal sponsored healthcare insurance, including Veterans Administration, Medicaid, or Medicare. °  You generally cannot be eligible for healthcare insurance through your employer.  °    How to apply: °Eligibility screenings are held every Tuesday and Wednesday afternoon from 1:00 pm until 4:00 pm. You do not need an appointment for the interview!  °[facility] [address], [place], [place] 336-631-2330   °[place] [facility]  336-342-8273   °[place] [facility]  336-703-3100   °[facility]  336-570-6415   ° °Behavioral Health Resources in the Community: °Intensive Outpatient Programs °Organization         Address  Phone  Notes  °[facility] [address], [place], [place] 336-878-6098   °[facility] [address], [place], [place] 336-832-9800   °[facility] [address], [place], [place] ° 336-882-2125   °[facility] [address],  °[place], [place] 1-800-853-5163 or 336-641-4981   °Substance Abuse Resources °Organization         Address  Phone  Notes  °[facility]  336-882-2125   °[facility]  336-784-9470   °[facility]  336-285-9073   °[facility]  336-845-3988   °Residential & Outpatient Substance Abuse Program  1-800-659-3381   °Psychological Services °Organization         Address  Phone  Notes  °[facility]  336- 832-9600   °[facility]  336- 378-7881   °[facility] [address], [place] 1-800-853-5163 or 336-641-4981   ° °Mobile Crisis  Teams °Organization         Address  Phone  Notes  °[facility], Mobile Crisis Care Unit  1-877-626-1772   °Assertive °[facility] ° [address] [place], [place] 336-834-9664   °Sharon DeEsch [address] °[place] 336-554-5454   ° °Self-Help/Support Groups °Organization         Address  Phone             Notes  °[facility] [place] - variety of support groups  336- 373-1402 Call for more information  °Narcotics Anonymous (NA), [facility] [address], °[place]  2 meetings at this location  ° °Residential Treatment Programs °Organization         Address  Phone  Notes  °[facility] [address],    °[place]  1-866-801-8205   °[facility] ° [address], [place], [place] 704-293-8524   °[facility] [address], [place] 336-845-3988 Admissions: 8am-3pm M-F  °[facility] [address]    °[place], [place] 336-841-1104   °[facility] [address], [place], [place] 336-379-7146   °[facility] [address]  °[place], [place] 336-285-9073   °[facility] Intensive Outpatient [address], [place], [place] 336-852-3033   °[facility] [address]  °[place], [place] 1-877-615-2722 or 336-784-9470   °[facility] ([facility]) [address] [place], [place] 336-227-7417 Accepts Medicaid  °[facility] [address]  °[place] 1-800-659-3381 Substance Abuse/Addiction Treatment  ° °[place] Behavioral Health Resources °Organization         Address  Phone  Notes  °[facility]  (888) 581-9988   °Julie Brannon, PhD [address] [place], [place]   (336) 349-5553 or (336) 951-0000   °[facility]   [address] °[place], [place] (336) 349-4454   °[facility] [address],   [place], [place] 512-680-7217(336) 417-095-2423  Insurance/Medicaid/sponsorship through [facility]  [facility] [address]                                    [place], [place] [phone](336) [phone] Therapy/tele-psych/case  [facility] [address]   [place], [place] 347-186-3924(336) [phone]    Dr. Lolly MustacheArfeen  ([phone](336) [phone]   [facility]  [employer] [facility] [facility] 1) [address], [place] 2) [address], [place] 3)  [address], [place] [phone](336) (205)[phone] [phone](336) (905)[phone]  [phone](336) [phone]   [facility] Child Abuse Hot[facility] (859) 597-5076(336) (307) 872-6072 or 707-744-9225(336) (713)266-3769 (After Hours)       Emergency Department Resource Guide 1) Find a Doctor and Pay Out of Pocket Although you won't have to find out who is covered by your insurance plan, it is a good idea to ask around and get recommendations. You will then need to call the office and see if the doctor you have chosen will accept you as a new patient and what types of options they offer for patients who are self-pay. Some doctors offer discounts or will set up payment plans for their patients who do not have insurance, but you will need to ask so you aren't surprised when you get to your appointment.  2) Contact Your Local Health Department Not all health departments have doctors that can see patients for sick visits, but many do, so it is worth a call to see if yours does. If you don't know where your local health department is, you can check in your phone book. The CDC also has a tool to help you locate your state's health department, and many state websites also have listings of all of their local health departments.  3) Find a Walk-in Clinic If your illness is not likely to be very severe or complicated, you may want to try a walk in clinic. These are popping up all over the country in pharmacies, drugstores, and shopping centers. They're usually staffed by nurse practitioners or physician assistants that have been trained to treat common illnesses and  complaints. They're usually fairly quick and inexpensive. However, if you have serious medical issues or chronic medical problems, these are probably not your best option.  No Primary Care Doctor: - Call Health Connect at  ([phone]) [phone] - they can help you locate a primary care doctor that  accepts your insurance, provides certain services, etc. - Physician Referral Service- [phone]  Chronic Pain Problems: Organization         Address  Phone   Notes  [facility]  [phone](336) [phone] Patients need to be referred by their primary care doctor.   Medication Assistance: Organization         Address  Phone   Notes  [facility] [address] [place], [place] [address] [phone](336) [phone] --Must be a resident of [place] -- Must have NO insurance coverage whatsoever (no Medicaid/ Medicare, etc.) -- The pt. MUST have a primary care doctor that directs their care regularly and follows them in the community   Med[facility]ist  ([phone](866) [phone]   [employer]  [phone](888) [phone]    Agencies that provide inexpensive medical care: Organization         Address  Phone   Notes  [facility]  [phone](336)  [phone]   [facility]    ([phone]   [facility] [place] [address] [place], [place] [address] [phone]   [facility] [address] [place] [place] [address], [place] [phone]   [facility]    856-885-7982   [facility]    [phone]   [facility]  [address], [place] Phone:  [phone], Fax:  [phone] Hours of Operation:  9 am - 6 pm, M-F.  Also accepts Medicaid/Medicare and self-pay.  [facility]  [address], [place] Phone: [phone], Fax: [phone]. Hours of Operation:  8:30 am - 5:30 pm, M-F.  Also accepts Medicaid and self-pay.  [facility] [place] [address], [place] Phone: ([phone]) [phone]   [facility] [address] Natasha Bence [place], [place] [phone], Ext. 123 Mondays & Thursdays: 7-9 AM.  First 15 patients are seen on a first come, first serve basis.    Medicaid-accepting [facility] Providers:  Organization         Address  Phone   Notes  [facility] [address], [place] [phone] Also accepts self-pay patients.  [facility] [address] Laurell Josephs [place], [place]  [phone]   [facility] [address], [place] [phone]   [facility] [address], [place] [phone]   Renaye Rakers [address], [place]   [phone] Only accepts [place] Access [place] patients after they have their name applied to their card.   Self-Pay (no insurance) in [facility]:  Organization         Address  Phone   Notes  Sickle Cell Patients, [facility] [address] [place], [place] [phone]   [facility] [address] [place], [place] [phone]   [facility]  [address], [place] [phone]   [facility]/Dr. Osei-Bonsu  [address], [place] or [address], [place] [phone] Phone number for both [place] and [place] locations is the same.  [facility] [address] [place] [phone]   [facility] [address] [place] [address], [place] or [address] [phone]   [facility] [address] [place] [phone], phone; [phone], fax Sees patients 1st and 3rd Saturday of every month.  Must not qualify for public or private insurance (i.e. Medicaid, Medicare, [place] Health Choice, Veterans' Benefits)  Household income should be no more than 200% of the poverty level  The clinic cannot treat you if you are pregnant or think you are pregnant  Sexually transmitted diseases are not treated at the clinic.    Dental Care: Organization         Address  Phone  Notes  [facility] [address] [place], [place] [phone] Accepts children up to age 7 who are enrolled in [place] or [place] Health Choice; pregnant women with a Medicaid card; and children who have applied for Medicaid or [place] Health Choice, but were declined, whose parents can pay a reduced fee at time of service.  [facility] of [employer]  [phone]  [address], [facility] [phone]) [phone] Accepts children up to age 30 who are enrolled in Medicaid or [place] Health Choice; pregnant women with a Medicaid card; and children who have applied for Medicaid or [place] Health Choice, but were declined, whose parents can pay a reduced fee at time of service.  [facility]  [address], [place] [phone]) [phone] Patients are seen by appointment only. Walk-ins are not accepted. [facility] will see patients 30 years of age and older. Monday - Tuesday (8am-5pm) Most Wednesdays (8:30-5pm) $30 per visit, cash only  [facility]  [address], [facility] [phone]) [phone] Patients are seen by appointment only. Walk-ins are not accepted. [facility] will see patients 30 years of age and older. One Wednesday Evening (Monthly: Volunteer Based).  $30 per visit, cash only  [facility]  [phone]) [phone] for adults; Children under age 574, call Graduate Pediatric Dentistry at [phone]) [phone]. Children aged 634-14, please call [phone] to request a pediatric application.  Dental services are provided in all areas of dental care including fillings, crowns and bridges, complete and partial dentures, implants, gum treatment, root canals, and extractions. Preventive care is  also provided. Treatment is provided to both adults and children. Patients are selected via a lottery and there is often a waiting list.   [facility] [address], [place]  [phone]) [phone] [email or web address]   [facility] [address], [place], [place] [phone], Ext. [phone] Second and Fourth Thursday of each month, opens at 6:30 AM; Clinic ends at 9 AM.  Patients are seen on a first-come first-served basis, and a limited number are seen during each clinic.   [facility]  [address], [place], [place] [phone]) [phone]   Eligibility Requirements You must have lived in [place], or [place] for at least the last three months.   You cannot be eligible for state or federal sponsored [place]healthcare insurance, including CIGNAVeterans Administration, [place], or [employer].   You generally cannot be eligible for healthcare insurance through your employer.    How to apply: Eligibility screenings are held every Tuesday and Wednesday afternoon from 1:00 pm until 4:00 pm. You do not need an appointment for the interview!  [facility] [address], [place], [place] [phone]   [facility]  [phone]   [facility]  [phone]   [facility]  [phone]    Behavioral Health Resources in the Community: Intensive Outpatient Programs Organization         Address  Phone  Notes  [facility] [address], [place], [place] [phone]   [facility] [address], [place], [place] [phone]   [facility] [address], [place], [place]  [phone]   [facility] [address],  [place], [place] [phone] or [phone]   Substance Abuse Resources Organization         Address  Phone  Notes  [facility]  [phone]   [facility]  [phone]   [facility]  [phone]   [facility]  [phone]   Residential & Outpatient Substance Abuse Program  [phone]   Psychological Services Organization         Address  Phone  Notes  [facility]  [phone]   [facility]  [phone]   [facility] [address], [place] [phone] or [phone]    Mobile Crisis Teams Organization         Address  Phone  Notes  [facility], Mobile Crisis Care Unit  [phone]   [facility]  [address] [place], [place] [phone]   Doristine Locks [address] [place] [phone]    Self-Help/Support Groups Organization         Address  Phone             Notes  [facility] [place] - variety of support groups  [phone] Call for more information  Narcotics Anonymous (NA), [facility] [address], [employer] [place]  2 meetings at this location   Statistician         Address  Phone  Notes  [facility] [address],    [place]  [phone]   [facility]  [address] [place] [id number], [employer], [place] [phone]   [facility] [address] [place], [place] [phone] Admissions: 8am-3pm M-F  [facility] [address],    [place], [place] [phone]   [facility] [address] [place], [place], [place] [phone]   [facility] [address]  [place], [place] [phone]   [facility] - Intensive Outpatient [address], [place], [place] [phone]   [facility] ([facility] [address] [place]  [place], [place] [phone] or [phone]   [facility] ([facility]) [address], [place], [place] [phone] Accepts Medicaid  [facility] [address]  [place] [phone]  Substance Abuse/Addiction Treatment   [facility] Organization         Address  Phone  Notes  [facility]  [phone]   Angie Fava, PhD [address] [place], [place]   [phone] or [phone]   [facility]   [address] [place], [place] [phone]   [facility] [address], [place], [place] [phone] Insurance/Medicaid/sponsorship through [facility] [address]                                    [place], [place] [phone] Therapy/tele-psych/case  [facility] [address], [place] [phone]    Dr. Lolly Mustache  [phone]   [facility]  [employer] [facility] Dept. 1) [address], [place] 2) [address] [place] 3)  [address], [place] [phone]  [phone]   [facility] Child Abuse Hotline [phone] or [phone] (After Hours)

## 2015-02-01 NOTE — ED Notes (Signed)
C/o groin itching and pain to groin x 1.5 months-has not been seen by MD for c/o

## 2015-02-01 NOTE — ED Provider Notes (Signed)
CSN: [id number]     Arrival date & time 02/01/15  1949 History  This chart was scribed for Elwin MochaBlair Erving Sassano, MD by Octavia HeirArianna Nassar, ED Scribe. This patient was seen in room MH03/MH03 and the patient's care was started at 8:34 PM.    Chief Complaint  Patient presents with  . Groin Pain      Patient is a 29 y.o. male presenting with groin pain. The history is provided by the patient. No language interpreter was used.  Groin Pain This is a recurrent problem. The current episode started more than 1 week ago. The problem occurs constantly. The problem has been gradually worsening. Pertinent negatives include no abdominal pain. Nothing aggravates the symptoms. Nothing relieves the symptoms. He has tried acetaminophen for the symptoms.   HPI Comments: Dale Hill is a 29 y.o. male who presents to the Emergency Department complaining of intermittent, recurrent, gradual worsening groin itching onset 1.5 months ago. Pt states having associated pain, burning and drainage. Pt states the rash will come on, start itching, burn, and then scab over. He notes it will go away and then come right back. Pt has tried OTC monistat to alleviate the itch with no relief. He denies dysuria, painful urination, abdominal pain, loss of appetite, vomiting, diarrhea.  History reviewed. No pertinent past medical history. Past Surgical History  Procedure Laterality Date  . Tonsillectomy    . Adenoidectomy     No family history on file. History  Substance Use Topics  . Smoking status: Current Every Day Smoker -- 0.00 packs/day    Types: Cigarettes  . Smokeless tobacco: Not on file  . Alcohol Use: Yes    Review of Systems  Constitutional: Negative for appetite change.  Gastrointestinal: Negative for vomiting, abdominal pain and diarrhea.  Genitourinary: Negative for dysuria.  All other systems reviewed and are negative.     Allergies  Septra  Home Medications   Prior to Admission medications   Medication  Sig Start Date End Date Taking? Authorizing Provider  albuterol (PROVENTIL HFA;VENTOLIN HFA) 108 (90 BASE) MCG/ACT inhaler Inhale 1-2 puffs into the lungs every 6 (six) hours as needed for wheezing or shortness of breath. 08/03/14   April Palumbo, MD   Triage vitals: BP 151/90 mmHg  Pulse 110  Temp(Src) 97.9 F (36.6 C) (Oral)  Resp 16  Ht 6' (1.829 m)  Wt 175 lb (79.379 kg)  BMI 23.73 kg/m2  SpO2 96% Physical Exam  Constitutional: He is oriented to person, place, and time. He appears well-developed and well-nourished. No distress.  HENT:  Head: Normocephalic and atraumatic.  Mouth/Throat: No oropharyngeal exudate.  Eyes: EOM are normal. Pupils are equal, round, and reactive to light.  Neck: Normal range of motion. Neck supple.  Cardiovascular: Normal rate and regular rhythm.  Exam reveals no friction rub.   No murmur heard. Pulmonary/Chest: Effort normal and breath sounds normal. No respiratory distress. He has no wheezes. He has no rales.  Abdominal: He exhibits no distension. There is no tenderness. There is no rebound. Hernia confirmed negative in the right inguinal area and confirmed negative in the left inguinal area.  Genitourinary: Cremasteric reflex is not absent on the right side. Cremasteric reflex is not absent on the left side.  Scrotum swollen, erythematous, tender to palpation. No redness or swelling of perineum. Testicles nontender.  Penile shaft is erythematous and there are multiple red lesions on glans. No ulcerative lesions. Scrotum is weeping and there are a few areas of purulence drainage.  Musculoskeletal:  Normal range of motion. He exhibits no edema.  Lymphadenopathy:       Right: No inguinal adenopathy present.       Left: No inguinal adenopathy present.  Neurological: He is alert and oriented to person, place, and time.  Skin: He is not diaphoretic.  Nursing note and vitals reviewed.   ED Course  Procedures  DIAGNOSTIC STUDIES: Oxygen Saturation is 96%  on RA, adequate by my interpretation.  COORDINATION OF CARE:  8:36 PM Discussed treatment plan which includes lab work with pt at bedside and pt agreed to plan.  Labs Review Labs Reviewed - No data to display  Imaging Review US Scrotum  02/01/2015   CLINICAL DATA:  Scrotal edema and swelling for 2 months, not improving with topical cream. Pain with walking.  EXAM: ULTRASOUND OF SCROTUM  TECHNIQUE: Complete ultrasound examination of the testicles, epididymis, and other scrotal structures was performed.  COMPARISON:  None.  FINDINGS: Right testicle  Measurements: 4.5 x 2.2 x 3.1 cm. No mass or microlithiasis visualized.  Left testicle  Measurements: 4.3 x 2.1 x 3 cm. No mass or microlithiasis visualized.  Right epididymis:  Normal in size and appearance.  Left epididymis:  Normal in size ; 4 mm anechoic cyst.  Hydrocele:  Trace LEFT hydrocele may be physiologic.  Varicocele:  None visualized.  Diffuse scrotal wall thickening, with increased vascularity. No discrete fluid collection.  IMPRESSION: Thickened hyperemic scrotal tissues concerning for cellulitis. No abscess.  No acute testicular process. Tiny LEFT epididymal head cyst. No evidence for testicular mass or other significant abnormality.   Electronically Signed   By: Awilda Metro M.D.   On: 02/01/2015 22:20   [place] Art/ven Flow Abd Pelv Doppler  02/01/2015   CLINICAL DATA:  Scrotal edema and swelling for 2 months, not improving with topical cream. Pain with walking.  EXAM: ULTRASOUND OF SCROTUM  TECHNIQUE: Complete ultrasound examination of the testicles, epididymis, and other scrotal structures was performed.  COMPARISON:  None.  FINDINGS: Right testicle  Measurements: 4.5 x 2.2 x 3.1 cm. No mass or microlithiasis visualized.  Left testicle  Measurements: 4.3 x 2.1 x 3 cm. No mass or microlithiasis visualized.  Right epididymis:  Normal in size and appearance.  Left epididymis:  Normal in size ; 4 mm anechoic cyst.  Hydrocele:  Trace LEFT  hydrocele may be physiologic.  Varicocele:  None visualized.  Diffuse scrotal wall thickening, with increased vascularity. No discrete fluid collection.  IMPRESSION: Thickened hyperemic scrotal tissues concerning for cellulitis. No abscess.  No acute testicular process. Tiny LEFT epididymal head cyst. No evidence for testicular mass or other significant abnormality.   Electronically Signed   By: Awilda Metro M.D.   On: 02/01/2015 22:20     EKG Interpretation None      MDM   Final diagnoses:  Cellulitis, scrotum    11-year-old male here with one month to 6 weeks of scrotal redness and swelling. States she'll get lesions to start his pruritic lesions and he'll scratch them, they will scab off, and then his full get red and swollen. He denies any STDs or new exposure. Girlfriend is in the room with her newborn baby. Denies any fever, vomiting, diarrhea. No abdominal pain. No dysuria. Here labs show mild white count of 12. Urine is negative. Scrotum is erythematous without any extension in the perineum. No concern for [place]. He does have weeping areas on the scrotum and the penile shaft. He has lesions on the glans of  the penis that are nonulcerative, nontender. They do not appear like chancres or herpetic lesions. I spoke with Dr. Nunzio Cory of urology who recommended STD testing which INR descent. Ultrasound recommended by urology was negative for abscess. It did show scrotal wall thickening consistent with cellulitis, which patient clinically has pedal point on doxycycline. Given resource guide for follow-up. Dr. Nunzio Cory felt that urology follow-up would be inappropriate as there are no surgical indications for scrotal cellulitis this time. Patient is not diabetic and I am not concerned for impending Fournier's gangrene and sepsis. I personally performed the services described in this documentation, which was scribed in my presence. The recorded information has been reviewed and is  accurate.    Elwin Mocha, MD 02/01/15 [phone]

## 2015-02-03 LAB — RPR: RPR: NONREACTIVE

## 2015-02-04 LAB — GC/CHLAMYDIA PROBE AMP (~~LOC~~) NOT AT ARMC
Chlamydia: NEGATIVE
Neisseria Gonorrhea: NEGATIVE

## 2015-02-06 LAB — HERPES SIMPLEX VIRUS CULTURE
Culture: NOT DETECTED
SPECIAL REQUESTS: NORMAL

## 2015-02-28 ENCOUNTER — Emergency Department (HOSPITAL_BASED_OUTPATIENT_CLINIC_OR_DEPARTMENT_OTHER)
Admission: EM | Admit: 2015-02-28 | Discharge: 2015-02-28 | Disposition: A | Discharge status: Home / Self Care | Payer: No Typology Code available for payment source | Attending: Emergency Medicine | Admitting: Emergency Medicine

## 2015-02-28 ENCOUNTER — Emergency Department (HOSPITAL_BASED_OUTPATIENT_CLINIC_OR_DEPARTMENT_OTHER): Payer: No Typology Code available for payment source

## 2015-02-28 ENCOUNTER — Encounter (HOSPITAL_BASED_OUTPATIENT_CLINIC_OR_DEPARTMENT_OTHER): Payer: Self-pay

## 2015-02-28 DIAGNOSIS — S24109A Unspecified injury at unspecified level of thoracic spinal cord, initial encounter: Secondary | ICD-10-CM | POA: Insufficient documentation

## 2015-02-28 DIAGNOSIS — S199XXA Unspecified injury of neck, initial encounter: Secondary | ICD-10-CM | POA: Diagnosis not present

## 2015-02-28 DIAGNOSIS — Y998 Other external cause status: Secondary | ICD-10-CM | POA: Diagnosis not present

## 2015-02-28 DIAGNOSIS — Z79899 Other long term (current) drug therapy: Secondary | ICD-10-CM | POA: Diagnosis not present

## 2015-02-28 DIAGNOSIS — Y92410 Unspecified street and highway as the place of occurrence of the external cause: Secondary | ICD-10-CM | POA: Insufficient documentation

## 2015-02-28 DIAGNOSIS — S161XXA Strain of muscle, fascia and tendon at neck level, initial encounter: Secondary | ICD-10-CM

## 2015-02-28 DIAGNOSIS — Y9389 Activity, other specified: Secondary | ICD-10-CM | POA: Diagnosis not present

## 2015-02-28 DIAGNOSIS — Z720 Tobacco use: Secondary | ICD-10-CM | POA: Diagnosis not present

## 2015-02-28 DIAGNOSIS — S29019A Strain of muscle and tendon of unspecified wall of thorax, initial encounter: Secondary | ICD-10-CM

## 2015-02-28 MED ORDER — CYCLOBENZAPRINE HCL 10 MG PO TABS: 10.0000 mg | ORAL_TABLET | Freq: Three times a day (TID) | ORAL | Status: DC | PRN
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
## 2015-03-06 ENCOUNTER — Emergency Department (HOSPITAL_BASED_OUTPATIENT_CLINIC_OR_DEPARTMENT_OTHER)
Admission: EM | Admit: 2015-03-06 | Discharge: 2015-03-06 | Disposition: A | Discharge status: Home / Self Care | Payer: Self-pay | Attending: Emergency Medicine | Admitting: Emergency Medicine

## 2015-03-06 ENCOUNTER — Emergency Department (HOSPITAL_BASED_OUTPATIENT_CLINIC_OR_DEPARTMENT_OTHER): Payer: Self-pay

## 2015-03-06 ENCOUNTER — Encounter (HOSPITAL_BASED_OUTPATIENT_CLINIC_OR_DEPARTMENT_OTHER): Payer: Self-pay | Admitting: Emergency Medicine

## 2015-03-06 DIAGNOSIS — M549 Dorsalgia, unspecified: Secondary | ICD-10-CM | POA: Insufficient documentation

## 2015-03-06 DIAGNOSIS — M542 Cervicalgia: Secondary | ICD-10-CM | POA: Insufficient documentation

## 2015-03-06 DIAGNOSIS — Z720 Tobacco use: Secondary | ICD-10-CM | POA: Insufficient documentation

## 2015-03-06 DIAGNOSIS — Z79899 Other long term (current) drug therapy: Secondary | ICD-10-CM | POA: Insufficient documentation

## 2015-03-06 DIAGNOSIS — M25519 Pain in unspecified shoulder: Secondary | ICD-10-CM | POA: Insufficient documentation

## 2015-03-06 NOTE — Discharge Instructions (Signed)
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
## 2015-03-06 NOTE — ED Notes (Signed)
Patient states that today the pain is the same, was lifting some laundry and has a sharp pain
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
## 2015-03-11 NOTE — ED Notes (Signed)
Patient called to state that he can not get in to the [facility] for a referral to Neurosurgery.  States he was told they are not accepting new patients.   Encouraged to call for a ED follow up with [facility] as a follow up to the ED visit.  Pt was very demanding that he was still in pain and has not been able to return to work, and that he can not take flexeril and watch his young son.  States he needs medication to help him rest and a note to extend his time off of work. Encouraged to f/u with Urgent Care, a PCP of his choice or return to the ed for further evaluation of his pain.

## 2015-04-16 ENCOUNTER — Emergency Department (HOSPITAL_BASED_OUTPATIENT_CLINIC_OR_DEPARTMENT_OTHER)
Admission: EM | Admit: 2015-04-16 | Discharge: 2015-04-16 | Discharge status: Against Medical Advice | Payer: Self-pay | Attending: Emergency Medicine | Admitting: Emergency Medicine

## 2015-04-16 ENCOUNTER — Encounter (HOSPITAL_BASED_OUTPATIENT_CLINIC_OR_DEPARTMENT_OTHER): Payer: Self-pay | Admitting: Emergency Medicine

## 2015-04-16 DIAGNOSIS — Z8739 Personal history of other diseases of the musculoskeletal system and connective tissue: Secondary | ICD-10-CM | POA: Insufficient documentation

## 2015-04-16 DIAGNOSIS — N492 Inflammatory disorders of scrotum: Secondary | ICD-10-CM | POA: Insufficient documentation

## 2015-04-16 DIAGNOSIS — Z720 Tobacco use: Secondary | ICD-10-CM | POA: Insufficient documentation

## 2015-04-16 DIAGNOSIS — R000 Tachycardia, unspecified: Secondary | ICD-10-CM | POA: Insufficient documentation

## 2015-04-16 DIAGNOSIS — Z79899 Other long term (current) drug therapy: Secondary | ICD-10-CM | POA: Insufficient documentation

## 2015-04-16 HISTORY — DX: Cervical disc disorder, unspecified, unspecified cervical region: M50.90

## 2015-04-16 NOTE — ED Provider Notes (Signed)
TIME SEEN: 2:50 AM  CHIEF COMPLAINT: Left testicular pain and swelling, draining pus  HPI: Pt is a 29 y.o. male with no significant past history who presents emergency department with swelling and pain to his left testicle that started last night. He has had an area on his scrotum that is oozing pus. No history of injury to this area. No dysuria or hematuria. No penile discharge. Has had an STD once 8 years ago that was treated. Is sexually active with one partner but they do not use protection. Has had scrotal cellulitis in the past. No history of testicular torsion.  ROS: See HPI Constitutional: no fever  Eyes: no drainage  ENT: no runny nose   Cardiovascular:  no chest pain  Resp: no SOB  GI: no vomiting GU: no dysuria Integumentary: no rash  Allergy: no hives  Musculoskeletal: no leg swelling  Neurological: no slurred speech ROS otherwise negative  PAST MEDICAL HISTORY/PAST SURGICAL HISTORY:  Past Medical History  Diagnosis Date  . Cervical disc disorder     MEDICATIONS:  Prior to Admission medications   Medication Sig Start Date End Date Taking? Authorizing Provider  DEXAMETHASONE, PAK, PO Take by mouth.   Yes Historical Provider, MD  albuterol (PROVENTIL HFA;VENTOLIN HFA) 108 (90 BASE) MCG/ACT inhaler Inhale 1-2 puffs into the lungs every 6 (six) hours as needed for wheezing or shortness of breath. 08/03/14   April Palumbo, MD  cyclobenzaprine (FLEXERIL) 10 MG tablet Take 1 tablet (10 mg total) by mouth 3 (three) times daily as needed. 02/28/15   Geoffery Lyons, MD    ALLERGIES:  Allergies  Allergen Reactions  . Septra [Sulfamethoxazole-Trimethoprim]     unknown    SOCIAL HISTORY:  Social History  Substance Use Topics  . Smoking status: Current Every Day Smoker -- 1.00 packs/day    Types: Cigarettes  . Smokeless tobacco: Not on file  . Alcohol Use: Yes    FAMILY HISTORY: No family history on file.  EXAM: BP 156/98 mmHg  Pulse 127  Temp(Src) 98.5 F (36.9 C)  (Oral)  Resp 16  Ht 6' (1.829 m)  Wt 185 lb (83.915 kg)  BMI 25.08 kg/m2  SpO2 98% CONSTITUTIONAL: Alert and oriented and responds appropriately to questions. Afebrile and nontoxic but appears uncomfortable HEAD: Normocephalic EYES: Conjunctivae clear, PERRL ENT: normal nose; no rhinorrhea; moist mucous membranes; pharynx without lesions noted NECK: Supple, no meningismus, no LAD  CARD: Regular and tachycardic; S1 and S2 appreciated; no murmurs, no clicks, no rubs, no gallops RESP: Normal chest excursion without splinting, patient is mildly tachypneic; breath sounds clear and equal bilaterally; no wheezes, no rhonchi, no rales, no hypoxia or respiratory distress, speaking full sentences ABD/GI: Normal bowel sounds; non-distended; soft, non-tender, no rebound, no guarding, no peritoneal signs GU:  Patient has a very large indurated area over the left scrotum with a small area that is draining purulent drainage, the entire scrotum is erythematous and warm, I'm unable to palpate patient's left testicle given this area of induration, right testicle is nontender and has no masses, there does not appear to be any erythema, warmth, induration, fluctuance or subcutaneous gas and the perineum, he is a circumcised male with normal urethral meatus without drainage and no lesions noted on his penis BACK:  The back appears normal and is non-tender to palpation, there is no CVA tenderness EXT: Normal ROM in all joints; non-tender to palpation; no edema; normal capillary refill; no cyanosis, no calf tenderness or swelling    SKIN:  Normal color for age and race; warm NEURO: Moves all extremities equally, sensation to light touch intact diffusely, cranial nerves II through XII intact PSYCH: The patient's mood and manner are appropriate. Grooming and personal hygiene are appropriate.  MEDICAL DECISION MAKING: Patient here with left testicular pain, swelling. He appears uncomfortable and is tachycardic but  afebrile. He is also slightly tachypneic. He appears very uncomfortable. Discussed with patient that I would like to start lab work, IV antibiotics, pain meds, IVF and transfer to [facility] for testicular ultrasound and possible urologic evaluation if needed. Patient refusing workup at [facility] and states he will drive himself to [facility] Asked patient to wait until we could get him appropriately transferred but he walked out AGAINST MEDICAL ADVICE without letting anyone know he was leaving. No workup had been started in the emergency department including antibiotics. It was not able to discuss risk of leaving AMA with him. I have called provider at [facility] to inform him that patient may be coming to their hospital for evaluation.       Layla Maw Ward, DO 04/16/15 [phone]

## 2015-04-16 NOTE — ED Notes (Signed)
MD at bedside discussing plan of care for transfer.

## 2015-04-16 NOTE — ED Notes (Signed)
Went to room to draw blood and pt stated that he didn't want to have the blood drawn here because sometimes he passes out and he needs to be driving over to [facility]. EDP wanted pt to start tx here and transfer to [facility] via ambulance but pt refused. MD went back to room to talk to pt about plan of care and pt was not in the room.  He has left the ED.

## 2015-04-16 NOTE — ED Notes (Signed)
MD at bedside. 

## 2015-04-16 NOTE — ED Notes (Signed)
Swelling and pain to left testicle since yesterday.  Pt sts 3 hours ago an area on scrotum started oozing brown pus. Denies any issues with urination.

## 2015-12-27 IMAGING — CT CT CERVICAL SPINE W/O CM
3 of 4 series · 9 of 33 positions shown, 11 images · non-contrast
Comparison: None.

CLINICAL DATA: Right neck pain after picking up the laundry today.

EXAM:
CT CERVICAL SPINE WITHOUT CONTRAST
TECHNIQUE: Multidetector CT imaging of the cervical spine was performed without
intravenous contrast. Multiplanar CT image reconstructions were also
generated.

[Series 4: c_spine 2.0 b41s st · axial · 0.23mm/px · z∈[-230,-230]mm · 1 of 89 slices shown, 2 images]
[im 45/89  soft-tissue]
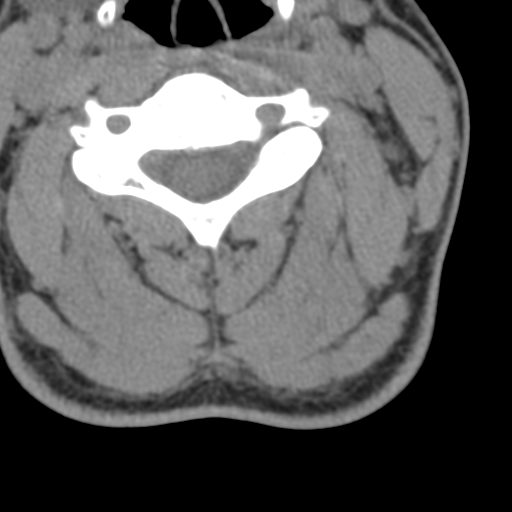
[im 45/89  bone]
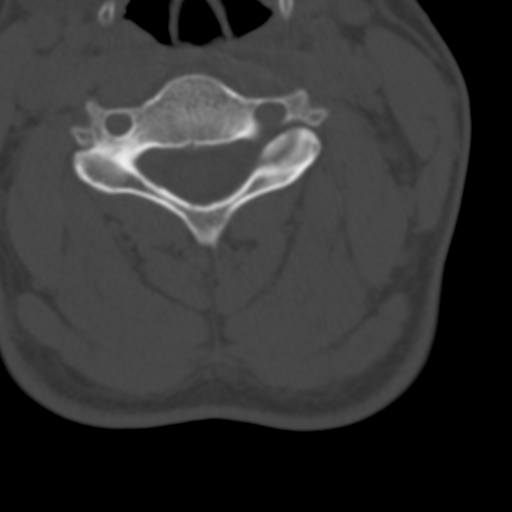

[Series 7: c_spine 2.0 coronal · coronal · 0.17mm/px · 3 of 25 slices shown]
[im 5/25  bone]
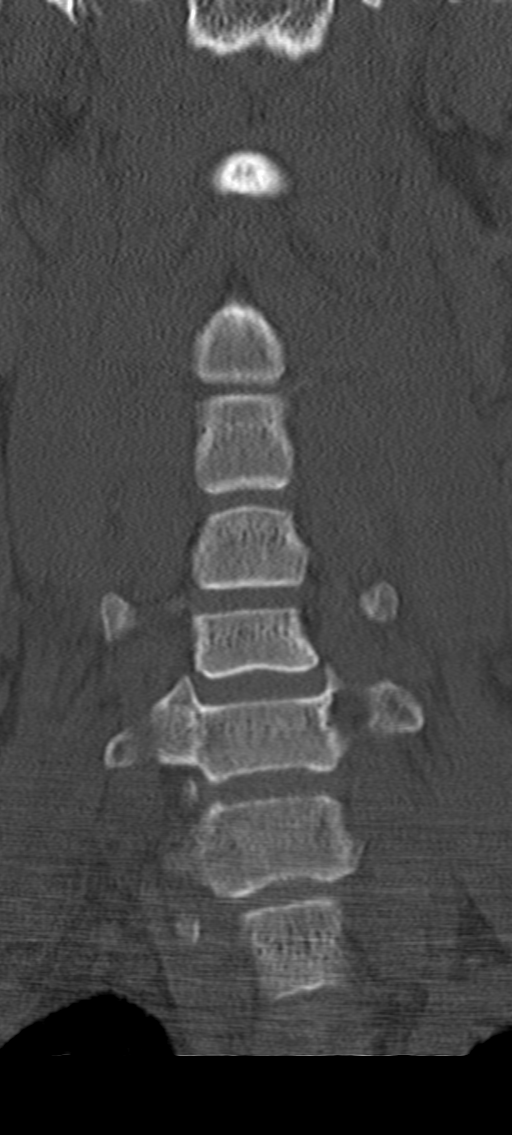
[im 10/25  bone]
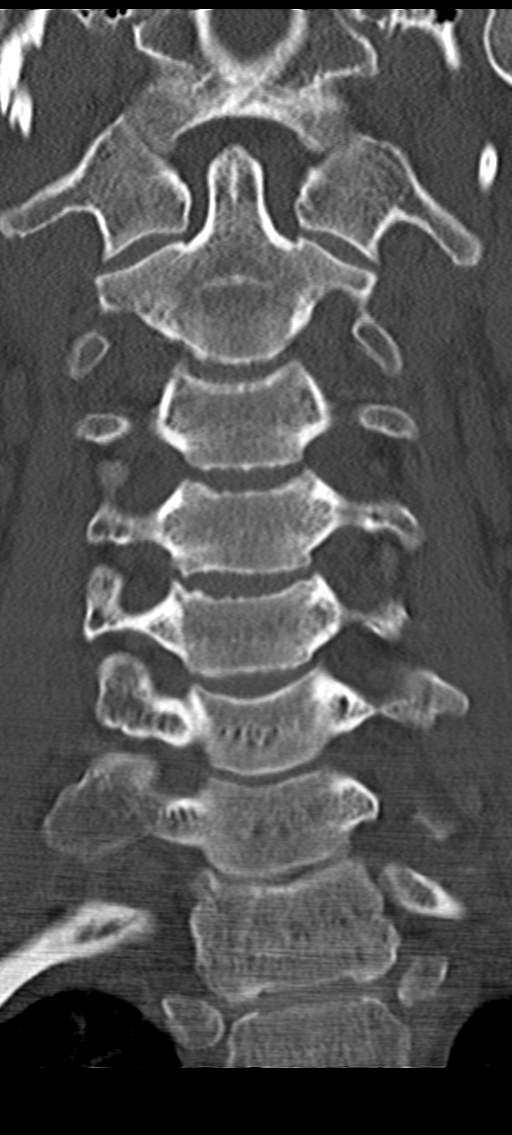
[im 15/25  bone]
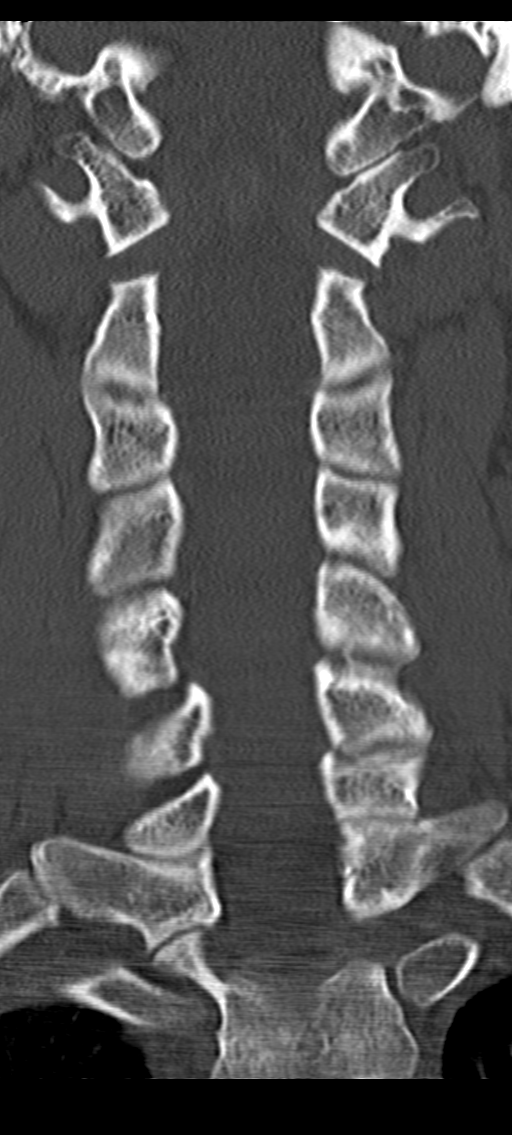

[Series 8: c_spine 2.0 sagittal · sagittal · 0.21mm/px · 5 of 35 slices shown, 6 images]
[im 12/35  bone]
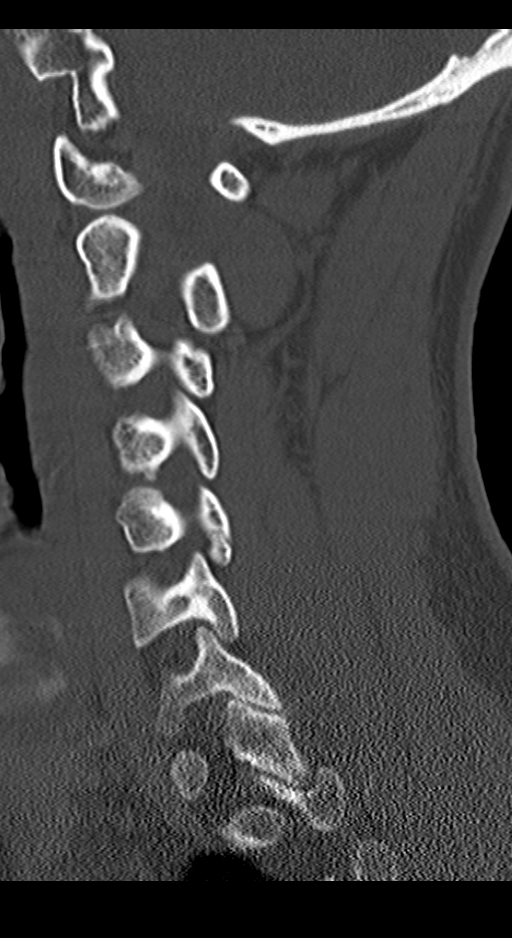
[im 15/35  bone]
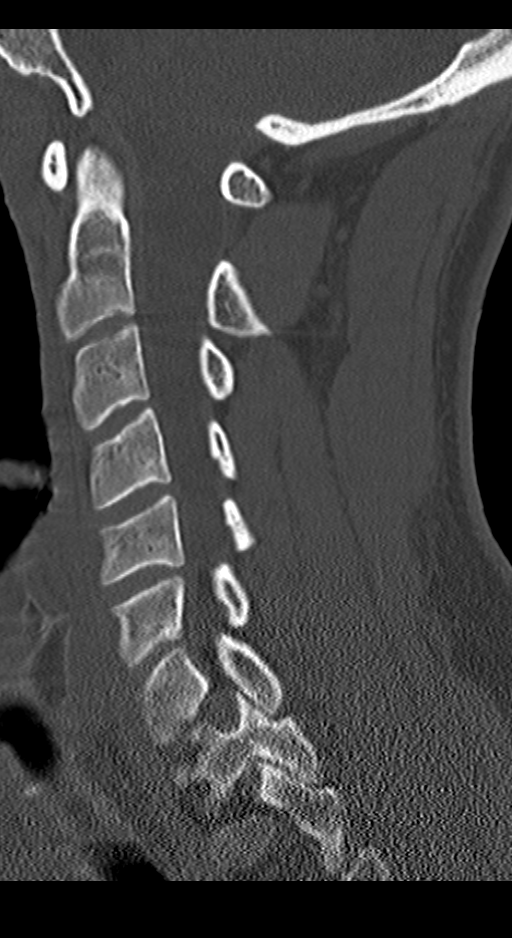
[im 18/35  soft-tissue]
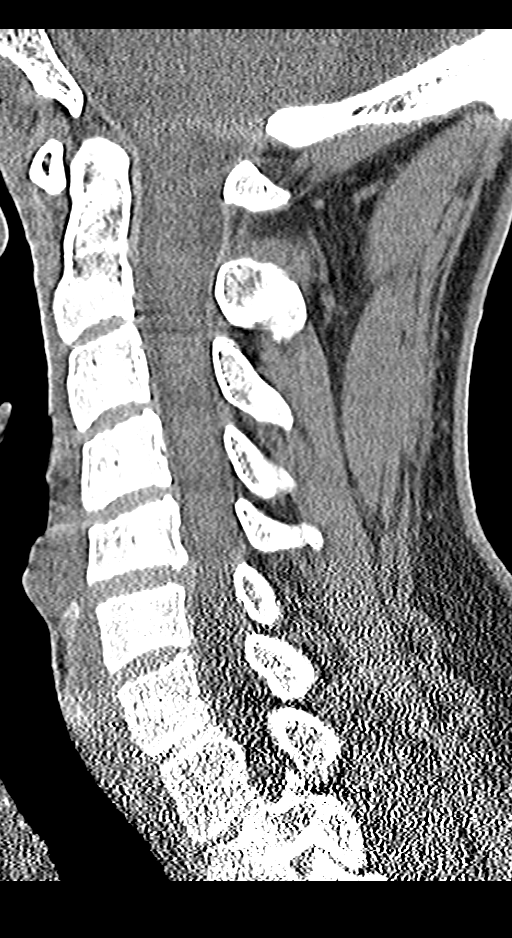
[im 18/35  bone]
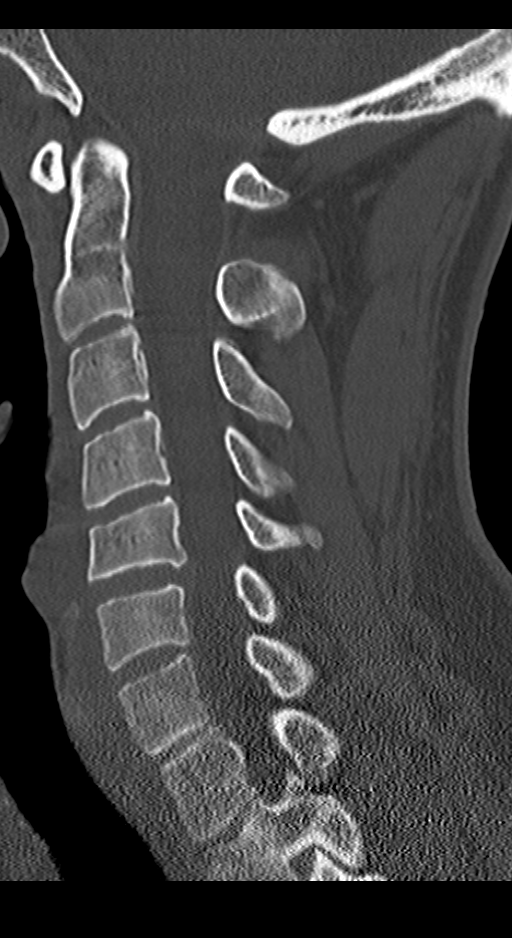
[im 20/35  bone]
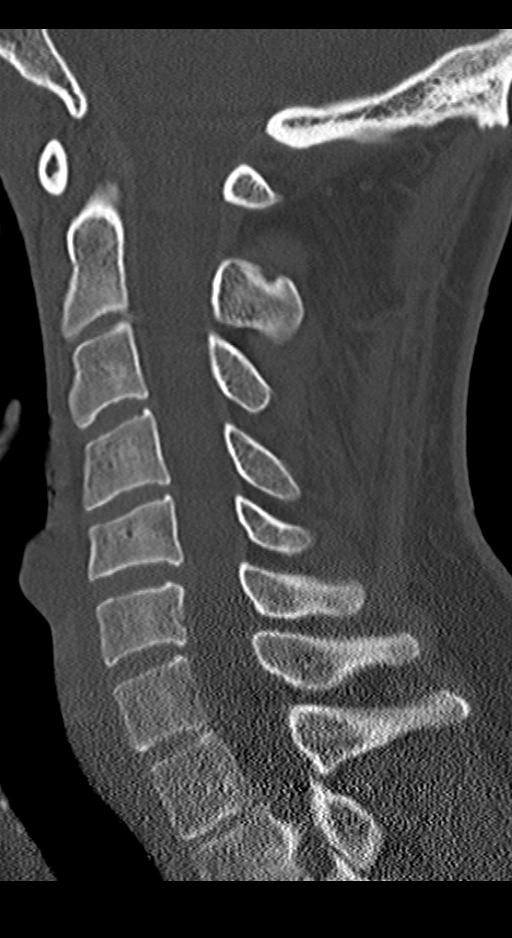
[im 23/35  bone]
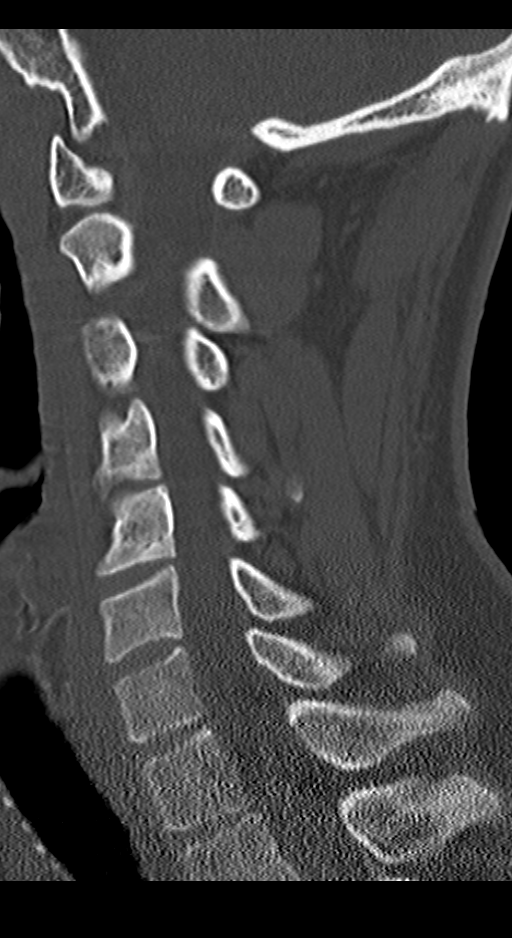

[9 of 33 positions shown; findings below may reference images not displayed]

FINDINGS: The alignment is anatomic. The vertebral body heights are
maintained. There is no acute fracture. There is no static
listhesis. The prevertebral soft tissues are normal. The intraspinal
soft tissues are not fully imaged on this examination due to poor
soft tissue contrast, but there is no gross soft tissue abnormality.

The disc spaces are maintained. Central disc protrusion at C5-6.

The visualized portions of the lung apices demonstrate no focal
abnormality.
IMPRESSION: 1. Central disc protrusion at C5-6.
2. No acute osseous injury of the cervical spine.

## 2022-05-20 ENCOUNTER — Other Ambulatory Visit: Payer: Self-pay

## 2022-05-20 ENCOUNTER — Emergency Department (HOSPITAL_BASED_OUTPATIENT_CLINIC_OR_DEPARTMENT_OTHER)
Admission: EM | Admit: 2022-05-20 | Discharge: 2022-05-20 | Disposition: A | Discharge status: Home / Self Care | Payer: Self-pay | Attending: Emergency Medicine | Admitting: Emergency Medicine

## 2022-05-20 ENCOUNTER — Emergency Department (HOSPITAL_BASED_OUTPATIENT_CLINIC_OR_DEPARTMENT_OTHER): Payer: Self-pay | Admitting: Radiology

## 2022-05-20 ENCOUNTER — Encounter (HOSPITAL_BASED_OUTPATIENT_CLINIC_OR_DEPARTMENT_OTHER): Payer: Self-pay

## 2022-05-20 DIAGNOSIS — J029 Acute pharyngitis, unspecified: Secondary | ICD-10-CM | POA: Insufficient documentation

## 2022-05-20 DIAGNOSIS — R062 Wheezing: Secondary | ICD-10-CM | POA: Insufficient documentation

## 2022-05-20 DIAGNOSIS — Z20822 Contact with and (suspected) exposure to covid-19: Secondary | ICD-10-CM | POA: Insufficient documentation

## 2022-05-20 DIAGNOSIS — R509 Fever, unspecified: Secondary | ICD-10-CM | POA: Insufficient documentation

## 2022-05-20 DIAGNOSIS — R079 Chest pain, unspecified: Secondary | ICD-10-CM | POA: Insufficient documentation

## 2022-05-20 DIAGNOSIS — M791 Myalgia, unspecified site: Secondary | ICD-10-CM | POA: Insufficient documentation

## 2022-05-20 DIAGNOSIS — R051 Acute cough: Secondary | ICD-10-CM | POA: Insufficient documentation

## 2022-05-20 LAB — GROUP A STREP BY PCR: Group A Strep by PCR: NOT DETECTED

## 2022-05-20 LAB — CBC WITH DIFFERENTIAL/PLATELET
Abs Immature Granulocytes: 0.02 10*3/uL (ref 0.00–0.07)
Basophils Absolute: 0.1 10*3/uL (ref 0.0–0.1)
Basophils Relative: 1 %
Eosinophils Absolute: 0.5 10*3/uL (ref 0.0–0.5)
Eosinophils Relative: 5 %
HCT: 48.2 % (ref 39.0–52.0)
Hemoglobin: 16 g/dL (ref 13.0–17.0)
Immature Granulocytes: 0 %
Lymphocytes Relative: 19 %
Lymphs Abs: 1.9 10*3/uL (ref 0.7–4.0)
MCH: 28.6 pg (ref 26.0–34.0)
MCHC: 33.2 g/dL (ref 30.0–36.0)
MCV: 86.1 fL (ref 80.0–100.0)
Monocytes Absolute: 0.6 10*3/uL (ref 0.1–1.0)
Monocytes Relative: 6 %
Neutro Abs: 6.9 10*3/uL (ref 1.7–7.7)
Neutrophils Relative %: 69 %
Platelets: 231 10*3/uL (ref 150–400)
RBC: 5.6 MIL/uL (ref 4.22–5.81)
RDW: 12.4 % (ref 11.5–15.5)
WBC: 10 10*3/uL (ref 4.0–10.5)
nRBC: 0 % (ref 0.0–0.2)

## 2022-05-20 LAB — RESP PANEL BY RT-PCR (FLU A&B, COVID) ARPGX2
Influenza A by PCR: NEGATIVE
Influenza B by PCR: NEGATIVE
SARS Coronavirus 2 by RT PCR: NEGATIVE

## 2022-05-20 LAB — BASIC METABOLIC PANEL
Anion gap: 9 (ref 5–15)
BUN: 16 mg/dL (ref 6–20)
CO2: 27 mmol/L (ref 22–32)
Calcium: 9.9 mg/dL (ref 8.9–10.3)
Chloride: 100 mmol/L (ref 98–111)
Creatinine, Ser: 0.99 mg/dL (ref 0.61–1.24)
GFR, Estimated: >60 mL/min (ref >60)
Glucose, Bld: 146 mg/dL — ABNORMAL HIGH (ref 70–99)
Potassium: 3.9 mmol/L (ref 3.5–5.1)
Sodium: 136 mmol/L (ref 135–145)

## 2022-05-20 LAB — TROPONIN I (HIGH SENSITIVITY): Troponin I (High Sensitivity): <2 ng/L (ref <18)

## 2022-05-20 LAB — D-DIMER, QUANTITATIVE: D-Dimer, Quant: <0.27 ug/mL-FEU (ref 0.00–0.50)

## 2022-05-20 MED ORDER — IPRATROPIUM-ALBUTEROL 0.5-2.5 (3) MG/3ML IN SOLN
3.0000 mL | Freq: Once | RESPIRATORY_TRACT | Status: AC
  Administered 2022-05-20: 3 mL via RESPIRATORY_TRACT
  Filled 2022-05-20: qty 3

## 2022-05-20 MED ORDER — ALBUTEROL SULFATE HFA 108 (90 BASE) MCG/ACT IN AERS
1.0000 | INHALATION_SPRAY | Freq: Once | RESPIRATORY_TRACT | Status: DC
  Filled 2022-05-20: qty 6.7

## 2022-05-20 MED ORDER — PREDNISONE 10 MG PO TABS: 20.0000 mg | ORAL_TABLET | Freq: Every day | ORAL | 0 refills | Status: DC

## 2022-05-20 MED ORDER — METHYLPREDNISOLONE SODIUM SUCC 125 MG IJ SOLR
125.0000 mg | Freq: Once | INTRAMUSCULAR | Status: AC
  Administered 2022-05-20: 125 mg via INTRAVENOUS
  Filled 2022-05-20: qty 2

## 2022-05-20 MED ORDER — DOXYCYCLINE HYCLATE 100 MG PO CAPS: 100.0000 mg | ORAL_CAPSULE | Freq: Two times a day (BID) | ORAL | 0 refills | Status: DC

## 2022-05-20 NOTE — ED Notes (Signed)
Pt dc home with family. Opportunities given for questioning. Verbalizes understanding. Leanne Chang, RN

## 2022-05-20 NOTE — ED Triage Notes (Signed)
Patient here POV from Home.  Endorses Cough that began approximately 1 Week ago. Recently noted some Blood in Cough.   Associated with Chills, Sore Throat. No Known Fevers.  NAD Noted during Triage. A&Ox4. GCS 15. Ambulatory.

## 2022-05-20 NOTE — ED Provider Notes (Signed)
[name] EMERGENCY DEPT Provider Note   CSN: [id number] Arrival date & time: 05/20/22  1122     History  Chief Complaint  Patient presents with   Cough    Dale Hill is a 36 y.o. male.   Cough    Patient with medical history of alcohol use disorder in remission since 2017, history of MRSA infection, tobacco dependence current presents today due to hemoptysis.  Patient has been having a nonproductive cough for about a week.  Happened after a baseball game, his wife is also sick.  Associated with fever, sore throat and generalized body aches.  Yesterday he had an episode of bright red hemoptysis, unclear amount, states about a table spoon. No episodes today.   Associated with left-sided chest pain which is intermittent and sharp.  He feels short of breath when he lays down flat.  No history of previous episodes of hemoptysis, denies any abdominal pain.  No recent travel or surgeries, no lower extremity swelling, no history of PEs and patient is not anticoagulated.  Home Medications Prior to Admission medications   Medication Sig Start Date End Date Taking? Authorizing Provider  doxycycline (VIBRAMYCIN) 100 MG capsule Take 1 capsule (100 mg total) by mouth 2 (two) times daily. 05/20/22  Yes Sherrill Raring, PA-C  predniSONE (DELTASONE) 10 MG tablet Take 2 tablets (20 mg total) by mouth daily. 05/20/22  Yes Sherrill Raring, PA-C  albuterol (PROVENTIL HFA;VENTOLIN HFA) 108 (90 BASE) MCG/ACT inhaler Inhale 1-2 puffs into the lungs every 6 (six) hours as needed for wheezing or shortness of breath. 08/03/14   Palumbo, April, MD  cyclobenzaprine (FLEXERIL) 10 MG tablet Take 1 tablet (10 mg total) by mouth 3 (three) times daily as needed. 02/28/15   Veryl Speak, MD  DEXAMETHASONE, PAK, PO Take by mouth.    [provider]      Allergies    Septra [sulfamethoxazole-trimethoprim] and Septra [sulfamethoxazole-trimethoprim]    Review of Systems   Review of Systems   Respiratory:  Positive for cough.     Physical Exam Updated Vital Signs BP 120/79 (BP Location: Right Arm)   Pulse 90   Temp 98.4 F (36.9 C) (Oral)   Resp 18   Ht 6' (1.829 m)   Wt 95.3 kg   SpO2 99%   BMI 28.48 kg/m  Physical Exam Vitals and nursing note reviewed. Exam conducted with a chaperone present.  Constitutional:      Appearance: Normal appearance.  HENT:     Head: Normocephalic and atraumatic.     Nose: No congestion.     Mouth/Throat:     Pharynx: Posterior oropharyngeal erythema present.  Eyes:     General: No scleral icterus.       Right eye: No discharge.        Left eye: No discharge.     Extraocular Movements: Extraocular movements intact.     Pupils: Pupils are equal, round, and reactive to light.  Cardiovascular:     Rate and Rhythm: Normal rate and regular rhythm.     Pulses: Normal pulses.     Heart sounds: Normal heart sounds. No murmur heard.    No friction rub. No gallop.  Pulmonary:     Effort: Pulmonary effort is normal. No respiratory distress.     Breath sounds: Wheezing present.     Comments: Speaking complete sentences, no tachypnea.  Patient has expiratory wheezing diffusely through all lobes. Abdominal:     General: Abdomen is flat. Bowel sounds  are normal. There is no distension.     Palpations: Abdomen is soft.     Tenderness: There is no abdominal tenderness.     Comments: Abdomen is soft and nontender  Skin:    General: Skin is warm and dry.     Coloration: Skin is not jaundiced.  Neurological:     Mental Status: He is alert. Mental status is at baseline.     Coordination: Coordination normal.     ED Results / Procedures / Treatments   Labs (all labs ordered are listed, but only abnormal results are displayed) Labs Reviewed  BASIC METABOLIC PANEL - Abnormal; Notable for the following components:      Result Value   Glucose, Bld 146 (*)    All other components within normal limits  RESP PANEL BY RT-PCR (FLU A&B, COVID)  ARPGX2  GROUP A STREP BY PCR  CBC WITH DIFFERENTIAL/PLATELET  D-DIMER, QUANTITATIVE  TROPONIN I (HIGH SENSITIVITY)    EKG None  Radiology DG Chest 2 View  Result Date: 05/20/2022 CLINICAL DATA:  Hemoptysis. EXAM: CHEST - 2 VIEW COMPARISON:  Chest 01/12/2016 FINDINGS: The heart size and mediastinal contours are within normal limits. Both lungs are clear. The visualized skeletal structures are unremarkable. IMPRESSION: No active cardiopulmonary disease. Electronically Signed   By: Marlan Palau M.D.   On: 05/20/2022 11:58    Procedures Procedures    Medications Ordered in ED Medications  ipratropium-albuterol (DUONEB) 0.5-2.5 (3) MG/3ML nebulizer solution 3 mL (3 mLs Nebulization Given 05/20/22 1308)  ipratropium-albuterol (DUONEB) 0.5-2.5 (3) MG/3ML nebulizer solution 3 mL (3 mLs Nebulization Given 05/20/22 1409)  methylPREDNISolone sodium succinate (SOLU-MEDROL) 125 mg/2 mL injection 125 mg (125 mg Intravenous Given 05/20/22 1418)    ED Course/ Medical Decision Making/ A&P                           Medical Decision Making Amount and/or Complexity of Data Reviewed Labs: ordered. Radiology: ordered.  Risk Prescription drug management.   Patient presents due to cough  x1 week.  Differential includes not limited to pneumonia, lung abscess, PE, symptomatic anemia, viral URI, malignancy, lung abscess.   I reviewed external medical records, no history of asthma or COPD.  Tobacco abuse.  Patient's wife is an independent story and at bedside.  On exam patient with diffuse wheezing, no active hemoptysis.  He is afebrile, not tachycardic with regular rhythm.  Upper and lower extremity pulses are symmetric bilaterally.  No hypoxia. -BP 120/79 (BP Location: Right Arm)   Pulse 90   Temp 98.4 F (36.9 C) (Oral)   Resp 18   Ht 6' (1.829 m)   Wt 95.3 kg   SpO2 99%   BMI 28.48 kg/m   I ordered, viewed and interpreted laboratory work-up. CBC without leukocytosis or anemia.' BMP  without gross electrolyte derangement or AKI. Dimer is negative. COVID and flu negative. Strep negative Troponin neg  Patient is on cardiac monitoring in sinus rhythm interpretation.  EKG shows sinus rhythm.  Chest x-ray ordered and viewed by myself.  Negative for any acute process, agree with radiologist interpretation. .  I considered PE study but given negative dimer, no hypoxia or tachycardia clinical suspicion is low and I do not think CTA is indicated.  I ordered DuoNeb's x2, inhaler, Solu-Medrol for the wheezing.  On reevaluation the wheezing has improved.  Considered admission but patient is well-appearing and I think outpatient follow-up is reasonable.  Patient does not  have a PCP currently, referral provided as well as a referral for pulmonology follow-up.  We will cover for atypical infection with outpatient abx given the new hemoptysis and week of symptom duration.  Inhaler given as well as steroids.  Presentation is suspicious for undiagnosed asthma or developing COPD   Return precautions were discussed with the patient as well as outpatient follow-up.  We also discussed smoking cessation.  All questions were answered, patient stabilized and appropriate for outpatient follow-up at this time.        Final Clinical Impression(s) / ED Diagnoses Final diagnoses:  Acute cough  Wheezing    Rx / DC Orders ED Discharge Orders          Ordered    predniSONE (DELTASONE) 10 MG tablet  Daily        05/20/22 1407    doxycycline (VIBRAMYCIN) 100 MG capsule  2 times daily        05/20/22 1407              Theron Arista, PA-C 05/20/22 2115    Blane Ohara, MD 06/01/22 [phone]

## 2022-05-20 NOTE — Discharge Instructions (Signed)
You are seen today in the emergency department due to coughing.  Your x-ray was reassuring without any signs of pneumonia but given the duration of your symptoms and worsening cough I want you to start taking the antibiotic doxycycline twice daily for 10 days to cover for an atypical pneumonia.  Use the albuterol inhaler inhaler as needed for wheezing.  Additionally take 40 mg of the prednisone daily, this is a steroid that should help reduce inflammation in your lungs.  Work on stopping smoking.  Call to establish care with a primary, information above also for pulmonologist.  Return to the ED for worsening cough, severe chest pain, return of significant blood in your sputum or new concerning symptoms.

## 2022-05-20 NOTE — ED Notes (Signed)
Pt discharged home after verbalizing understanding of discharge instructions; nad noted. 

## 2023-01-01 ENCOUNTER — Encounter (HOSPITAL_BASED_OUTPATIENT_CLINIC_OR_DEPARTMENT_OTHER): Payer: Self-pay | Admitting: Emergency Medicine

## 2023-01-01 ENCOUNTER — Other Ambulatory Visit (HOSPITAL_BASED_OUTPATIENT_CLINIC_OR_DEPARTMENT_OTHER): Payer: Self-pay

## 2023-01-01 ENCOUNTER — Emergency Department (HOSPITAL_BASED_OUTPATIENT_CLINIC_OR_DEPARTMENT_OTHER): Payer: Self-pay

## 2023-01-01 ENCOUNTER — Emergency Department (HOSPITAL_BASED_OUTPATIENT_CLINIC_OR_DEPARTMENT_OTHER)
Admission: EM | Admit: 2023-01-01 | Discharge: 2023-01-01 | Disposition: A | Discharge status: Home / Self Care | Payer: Self-pay | Attending: Emergency Medicine | Admitting: Emergency Medicine

## 2023-01-01 ENCOUNTER — Other Ambulatory Visit: Payer: Self-pay

## 2023-01-01 DIAGNOSIS — R051 Acute cough: Secondary | ICD-10-CM | POA: Insufficient documentation

## 2023-01-01 DIAGNOSIS — R079 Chest pain, unspecified: Secondary | ICD-10-CM | POA: Insufficient documentation

## 2023-01-01 MED ORDER — AZITHROMYCIN 250 MG PO TABS
250.0000 mg | ORAL_TABLET | Freq: Every day | ORAL | 0 refills | Status: DC
  Filled 2023-01-01: qty 6, 6d supply, fill #0

## 2023-01-01 MED ORDER — ALBUTEROL SULFATE HFA 108 (90 BASE) MCG/ACT IN AERS
1.0000 | INHALATION_SPRAY | RESPIRATORY_TRACT | Status: DC | PRN
  Administered 2023-01-01: 2 via RESPIRATORY_TRACT
  Filled 2023-01-01: qty 6.7

## 2023-01-01 MED ORDER — AMOXICILLIN-POT CLAVULANATE 875-125 MG PO TABS
1.0000 | ORAL_TABLET | Freq: Two times a day (BID) | ORAL | 0 refills | Status: DC
  Filled 2023-01-01: qty 10, 5d supply, fill #0

## 2023-01-01 NOTE — Discharge Instructions (Signed)
Repeat your workup today was consistent with concerns for pneumonia.  Will treat with his 2 antibiotics in the form of Augmentin as well as azithromycin.  Recommend follow-up with primary care for reassessment of your symptoms.  Please do not hesitate to return to emergency department if the worrisome signs and symptoms we discussed become apparent.

## 2023-01-01 NOTE — ED Provider Notes (Signed)
[place] [facility]Y [facility]MENT AT [facility] Provider Note   CSN: [id number] Arrival date & time: 01/01/23  1045     History  Chief Complaint  Patient presents with   Cough    Dale Hill is a 37 y.o. male.   Cough   37 year old male presents emergency department with complaints of.  Patient states been with cough since Monday of this week with some associated chest pain with cough.  Has felt somewhat short of breath since symptom onset.  Describes cough that is mildly productive in nature with some streaky blood noticed 2-3 times during coughing episodes..  Reports history of similar symptoms back in October of last year and states this feels similar.  Denies fever, chills, abdominal pain, nausea, vomiting, urinary symptoms, change in bowel habits.  Denies any chest pain or shortness of breath.  Denies any history of DVT/PE, recent surgery/immobilization, known coagulopathy/malignancy, hormone use  No significant pertinent past medical history.  Home Medications Prior to Admission medications   Medication Sig Start Date End Date Taking? Authorizing Provider  amoxicillin-clavulanate (AUGMENTIN) 875-125 MG tablet Take 1 tablet by mouth every 12 (twelve) hours. 01/01/23  Yes Sherian Maroon A, PA  azithromycin (ZITHROMAX) 250 MG tablet Take 1 tablet (250 mg total) by mouth daily. Take first 2 tablets together, then 1 every day until finished. 01/01/23  Yes Peter Garter, PA      Allergies    Septra [sulfamethoxazole-trimethoprim] and Septra [sulfamethoxazole-trimethoprim]    Review of Systems   Review of Systems  Respiratory:  Positive for cough.   All other systems reviewed and are negative.   Physical Exam Updated Vital Signs BP 127/85   Pulse 89   Temp 97.7 F (36.5 C)   Resp 18   Ht (S) 6' (1.829 m)   Wt 93.9 kg   SpO2 94%   BMI 28.07 kg/m  Physical Exam Vitals and nursing note reviewed.  Constitutional:      General: He is not in acute  distress.    Appearance: He is well-developed.  HENT:     Head: Normocephalic and atraumatic.  Eyes:     Conjunctiva/sclera: Conjunctivae normal.  Cardiovascular:     Rate and Rhythm: Normal rate and regular rhythm.     Heart sounds: No murmur heard. Pulmonary:     Effort: Pulmonary effort is normal. No respiratory distress.     Breath sounds: No wheezing or rhonchi.  Abdominal:     Palpations: Abdomen is soft.     Tenderness: There is no abdominal tenderness.  Musculoskeletal:        General: No swelling.     Cervical back: Neck supple.     Right lower leg: No edema.     Left lower leg: No edema.  Skin:    General: Skin is warm and dry.     Capillary Refill: Capillary refill takes less than 2 seconds.  Neurological:     Mental Status: He is alert.  Psychiatric:        Mood and Affect: Mood normal.     ED Results / Procedures / Treatments   Labs (all labs ordered are listed, but only abnormal results are displayed) Labs Reviewed - No data to display  EKG EKG Interpretation  Date/Time:  Friday January 01 2023 10:52:54 EDT Ventricular Rate:  95 PR Interval:  175 QRS Duration: 108 QT Interval:  348 QTC Calculation: 438 R Axis:   111 Text Interpretation: Sinus rhythm Right axis deviation  No previous ECGs available Confirmed by Elayne Snare ([id number]) on 01/01/2023 11:37:05 AM  Radiology DG Chest 2 View  Result Date: 01/01/2023 CLINICAL DATA:  Chest pain EXAM: CHEST - 2 VIEW COMPARISON:  08/03/2014 05/20/2022 FINDINGS: Cardiomediastinal silhouette and pulmonary vasculature are within normal limits. 2.7 x 2.1 cm ovoid nodular opacity noted in the right infrahilar lung. Lungs otherwise clear. IMPRESSION: 2.7 x 2.1 cm ovoid nodular opacity in the right infrahilar lung may be due to focal pneumonitis. Follow-up chest radiograph recommended in 4-6 weeks to again evaluate this region. Electronically Signed   By: Acquanetta Belling M.D.   On: 01/01/2023 11:14    Procedures Procedures     Medications Ordered in ED Medications - No data to display  ED Course/ Medical Decision Making/ A&P Clinical Course as of 01/01/23 1811  Fri Jan 01, 2023  1149 Decision-making conversation was had with patient regarding obtaining further workup with CT imaging and laboratory studies for rule out of PE/TB/malignancy/etc.  Patient reassured with evidence of pneumonitis on x-ray imaging and declined any further workup at this time. [CR]    Clinical Course User Index [CR] Peter Garter, PA                             Medical Decision Making Amount and/or Complexity of Data Reviewed Radiology: ordered.  Risk Prescription drug management.   This patient presents to the ED for concern of cough, this involves an extensive number of treatment options, and is a complaint that carries with it a high risk of complications and morbidity.  The differential diagnosis includes pneumonia, GERD, malignancy,  TB PE, bronchitis, bronchiectasis  Co morbidities that complicate the patient evaluation  See HPI   Additional history obtained:  Additional history obtained from EMR External records from outside source obtained and reviewed including hospital records   Lab Tests:  N/a   Imaging Studies ordered:  I ordered imaging studies including chest x-ray I independently visualized and interpreted imaging which showed ovoid nodule opacity in right upper hide suspicious for pneumonitis I agree with the radiologist interpretation  Cardiac Monitoring: / EKG:  The patient was maintained on a cardiac monitor.  I personally viewed and interpreted the cardiac monitored which showed an underlying rhythm of: Sinus rhythm with right axis deviation   Consultations Obtained:  N/a   Problem List / ED Course / Critical interventions / Medication management  Cough Reevaluation of the patient showed that the patient stayed the same I have reviewed the patients home medicines and have made  adjustments as needed   Social Determinants of Health:  Chronic cigarette use.  Denies illicit drug use.   Test / Admission - Considered:  Cough Vitals signs within normal range and stable throughout visit. Laboratory/imaging studies significant for: See above 37 year old male presents emergency department with complaints of 4 to 5-day history of cough with 2-3 episodes history of hemoptysis appreciated during coughing episodes earlier.  Patient with evidence of x-ray imaging findings concerning for pneumonia.  Shared decision-making conversation was had with patient regarding further workup versus treatment empirically for pneumonia given x-ray imaging findings and patient elected for treatment pneumonia.  Patient states that he has history of similar symptoms occurring in October of this past year with negative dimer at that time; patient without risk factors for DVT/PE, not tachycardic, not hypoxic, shortness of breath, without pleuritic chest pain.  Patient without recent international travel, intravenous drug use, prison sentencing,  immunocompromise state so low suspicion for development of TB.  Patient's chest pain seems to be isolated episodes of coughing and not experienced otherwise.  Offered patient imaging as well as laboratory studies for further workup regarding 2-3 episodes of streaky hemoptysis.  The patient declined and desired to assess symptoms after being treated with antibiotics.  Patient overall well-appearing, afebrile, in no acute respiratory distress, nonhypoxic on room air.  Treatment plan discussed with patient and he acknowledged understanding was agreeable to said plan.  Patient recommended close follow-up with primary care for reassessment of symptoms with strict return precautions. Worrisome signs and symptoms were discussed with the patient, and the patient acknowledged understanding to return to the ED if noticed. Patient was stable upon discharge. '        Final  Clinical Impression(s) / ED Diagnoses Final diagnoses:  Acute cough    Rx / DC Orders ED Discharge Orders          Ordered    amoxicillin-clavulanate (AUGMENTIN) 875-125 MG tablet  Every 12 hours        01/01/23 1126    azithromycin (ZITHROMAX) 250 MG tablet  Daily        01/01/23 1126              Peter Garter, [place] 01/01/23 1811    Elayne Snare K, DO 01/02/23 0700

## 2023-01-01 NOTE — ED Triage Notes (Addendum)
Cp sharp since Monday had some sob  denies n/v has had a bad cough  also since Monday  lots of congestion states had some blood in sputum

## 2023-09-02 DIAGNOSIS — R0981 Nasal congestion: Secondary | ICD-10-CM | POA: Diagnosis not present

## 2023-09-02 DIAGNOSIS — R059 Cough, unspecified: Secondary | ICD-10-CM | POA: Diagnosis not present

## 2023-09-02 DIAGNOSIS — J159 Unspecified bacterial pneumonia: Secondary | ICD-10-CM | POA: Diagnosis not present

## 2023-10-14 DIAGNOSIS — R197 Diarrhea, unspecified: Secondary | ICD-10-CM | POA: Diagnosis not present

## 2023-10-14 DIAGNOSIS — R112 Nausea with vomiting, unspecified: Secondary | ICD-10-CM | POA: Diagnosis not present

## 2023-10-14 DIAGNOSIS — M791 Myalgia, unspecified site: Secondary | ICD-10-CM | POA: Diagnosis not present

## 2023-12-14 ENCOUNTER — Ambulatory Visit (INDEPENDENT_AMBULATORY_CARE_PROVIDER_SITE_OTHER): Payer: Self-pay | Admitting: Family Medicine

## 2023-12-14 VITALS — BP 136/86 | HR 93 | Temp 98.0°F | Resp 16 | Ht 73.0 in | Wt 208.0 lb

## 2023-12-14 DIAGNOSIS — Z114 Encounter for screening for human immunodeficiency virus [HIV]: Secondary | ICD-10-CM | POA: Diagnosis not present

## 2023-12-14 DIAGNOSIS — Z1159 Encounter for screening for other viral diseases: Secondary | ICD-10-CM | POA: Diagnosis not present

## 2023-12-14 DIAGNOSIS — R5383 Other fatigue: Secondary | ICD-10-CM | POA: Diagnosis not present

## 2023-12-14 DIAGNOSIS — Z720 Tobacco use: Secondary | ICD-10-CM

## 2023-12-14 DIAGNOSIS — Z23 Encounter for immunization: Secondary | ICD-10-CM | POA: Diagnosis not present

## 2023-12-14 DIAGNOSIS — Z0000 Encounter for general adult medical examination without abnormal findings: Secondary | ICD-10-CM

## 2023-12-14 LAB — TSH: TSH: 1.5 u[IU]/mL (ref 0.35–5.50)

## 2023-12-14 LAB — CBC
HCT: 49 % (ref 39.0–52.0)
Hemoglobin: 16.3 g/dL (ref 13.0–17.0)
MCHC: 33.4 g/dL (ref 30.0–36.0)
MCV: 86.1 fl (ref 78.0–100.0)
Platelets: 206 10*3/uL (ref 150.0–400.0)
RBC: 5.68 Mil/uL (ref 4.22–5.81)
RDW: 13.3 % (ref 11.5–15.5)
WBC: 6.2 10*3/uL (ref 4.0–10.5)

## 2023-12-14 LAB — TESTOSTERONE: Testosterone: 331.49 ng/dL (ref 300.00–890.00)

## 2023-12-14 NOTE — Patient Instructions (Signed)
 Give [place] 2-3 business days to get the results of your labs back.   Keep the diet clean and stay active.  Please get me a copy of your advanced directive form at your convenience.   Do monthly self testicular checks in the shower. You are feeling for lumps/bumps that don't belong. If you feel anything like this, let me know!  Let us know if you need anything.

## 2023-12-14 NOTE — Progress Notes (Signed)
 Chief Complaint  Patient presents with   Establish Care    Establishing Care    Well Male Dale Hill is here for a complete physical.   His last physical was >1 year ago.  Current diet: in general, could be better.   Current exercise: coaches baseball Weight trend: stable Fatigue out of ordinary? yes. Seat belt? Yes.   Advanced directive? No  Health maintenance Tetanus- No HIV- No Hep C- No  Patient is been having fatigue over the past couple years.  Libido is normal.  He gets around 8 hours of sleep nightly.  He does not snore.  Mood is stable.  Diet/exercise as above.  Past Medical History:  Diagnosis Date   Cervical disc disorder      Past Surgical History:  Procedure Laterality Date   TONSILLECTOMY AND ADENOIDECTOMY      Medications  Takes no meds routinely.     Allergies Allergies  Allergen Reactions   Septra [Sulfamethoxazole-Trimethoprim]     unknown   Septra [Sulfamethoxazole-Trimethoprim] Other (See Comments)    Childhood Allergy    Family History Family History  Problem Relation Age of Onset   Diabetes Mother    Diabetes Father    Lung cancer Father    Lung cancer Maternal Grandfather    Heart disease Neg Hx    Prostate cancer Neg Hx    Colon cancer Neg Hx     Review of Systems: Constitutional: no fevers or chills Eye:  no recent significant change in vision Ear/Nose/Mouth/Throat:  Ears:  no hearing loss Nose/Mouth/Throat:  no complaints of nasal congestion, no sore throat Cardiovascular:  no chest pain Respiratory:  no shortness of breath Gastrointestinal:  no abdominal pain, no change in bowel habits GU:  Male: negative for dysuria Musculoskeletal/Extremities:  no new pain of the joints Integumentary (Skin/Breast):  no abnormal skin lesions reported Neurologic:  no headaches Endocrine: No unexpected weight changes Hematologic/Lymphatic:  no night sweats  Exam BP 136/86 (BP Location: Left Arm, Patient Position: Sitting)    Pulse 93   Temp 98 F (36.7 C) (Oral)   Resp 16   Ht 6\' 1"  (1.854 m)   Wt 208 lb (94.3 kg)   SpO2 96%   BMI 27.44 kg/m  General:  well developed, well nourished, in no apparent distress Skin:  no significant moles, warts, or growths Head:  no masses, lesions, or tenderness Eyes:  pupils equal and round, sclera anicteric without injection Ears:  canals without lesions, TMs shiny without retraction, no obvious effusion, no erythema Nose:  nares patent, mucosa normal Throat/Pharynx:  lips and gingiva without lesion; tongue and uvula midline; non-inflamed pharynx; no exudates or postnasal drainage Neck: neck supple without adenopathy, thyromegaly, or masses Lungs:  clear to auscultation, breath sounds equal bilaterally, no respiratory distress Cardio:  regular rate and rhythm, no bruits, no LE edema Abdomen:  abdomen soft, nontender; bowel sounds normal; no masses or organomegaly Genital (male): Deferred Rectal: Deferred Musculoskeletal:  symmetrical muscle groups noted without atrophy or deformity Extremities:  no clubbing, cyanosis, or edema, no deformities, no skin discoloration Neuro:  gait normal; deep tendon reflexes normal and symmetric Psych: well oriented with normal range of affect and appropriate judgment/insight  Assessment and Plan  Well adult exam - Plan: CBC, Comprehensive metabolic panel with GFR, Lipid panel  Screening for HIV without presence of risk factors - Plan: HIV Antibody (routine testing w rflx)  Encounter for hepatitis C screening test for low risk patient - Plan: Hepatitis  C antibody  Fatigue, unspecified type - Plan: TSH, Testosterone  Tobacco abuse  Need for Tdap vaccination - Plan: Tdap vaccine greater than or equal to 7yo IM  Need for pneumococcal 20-valent conjugate vaccination - Plan: Pneumococcal conjugate vaccine 20-valent (Prevnar 20)   Well 38 y.o. male. Counseled on diet and exercise. Self testicular exams recommended at least monthly.   Smoking cessation recommended.  Offered medication which he politely declined for now.  He will let me know if he changes his mind. Fatigue: Could improve on exercise and diet.  Check above labs.  Consider a multivitamin if not getting fruit/veggies in diet. Advanced directive form provided today.  Tdap and PCV20 today.  Other orders as above. Follow up in 1 year pending the above workup. The patient voiced understanding and agreement to the plan.  Shellie Dials [place], DO 12/14/23 12:13 PM

## 2023-12-15 ENCOUNTER — Other Ambulatory Visit (HOSPITAL_BASED_OUTPATIENT_CLINIC_OR_DEPARTMENT_OTHER): Payer: Self-pay

## 2023-12-15 ENCOUNTER — Other Ambulatory Visit: Payer: Self-pay

## 2023-12-15 ENCOUNTER — Other Ambulatory Visit: Payer: Self-pay | Admitting: Family Medicine

## 2023-12-15 ENCOUNTER — Ambulatory Visit: Payer: Self-pay | Admitting: Family Medicine

## 2023-12-15 DIAGNOSIS — E7800 Pure hypercholesterolemia, unspecified: Secondary | ICD-10-CM

## 2023-12-15 DIAGNOSIS — R748 Abnormal levels of other serum enzymes: Secondary | ICD-10-CM

## 2023-12-15 LAB — LIPID PANEL
Cholesterol: 259 mg/dL — ABNORMAL HIGH (ref 0–200)
HDL: 37.1 mg/dL — ABNORMAL LOW (ref >39.00)
LDL Cholesterol: 189 mg/dL — ABNORMAL HIGH (ref 0–99)
NonHDL: 221.51
Total CHOL/HDL Ratio: 7
Triglycerides: 162 mg/dL — ABNORMAL HIGH (ref 0.0–149.0)
VLDL: 32.4 mg/dL (ref 0.0–40.0)

## 2023-12-15 LAB — HEPATITIS C ANTIBODY: Hepatitis C Ab: NONREACTIVE

## 2023-12-15 LAB — COMPREHENSIVE METABOLIC PANEL WITH GFR
ALT: 69 U/L — ABNORMAL HIGH (ref 0–53)
AST: 39 U/L — ABNORMAL HIGH (ref 0–37)
Albumin: 4.6 g/dL (ref 3.5–5.2)
Alkaline Phosphatase: 50 U/L (ref 39–117)
BUN: 13 mg/dL (ref 6–23)
CO2: 24 meq/L (ref 19–32)
Calcium: 9.4 mg/dL (ref 8.4–10.5)
Chloride: 106 meq/L (ref 96–112)
Creatinine, Ser: 0.85 mg/dL (ref 0.40–1.50)
GFR: 109.7 mL/min (ref >60.00)
Glucose, Bld: 87 mg/dL (ref 70–99)
Potassium: 4 meq/L (ref 3.5–5.1)
Sodium: 139 meq/L (ref 135–145)
Total Bilirubin: 0.6 mg/dL (ref 0.2–1.2)
Total Protein: 7.2 g/dL (ref 6.0–8.3)

## 2023-12-15 LAB — HIV ANTIBODY (ROUTINE TESTING W REFLEX): HIV 1&2 Ab, 4th Generation: NONREACTIVE

## 2023-12-15 MED ORDER — VARENICLINE TARTRATE 1 MG PO TABS
1.0000 mg | ORAL_TABLET | Freq: Two times a day (BID) | ORAL | 0 refills | Status: DC
  Filled 2023-12-15: qty 60, 30d supply, fill #0

## 2023-12-15 MED ORDER — VARENICLINE TARTRATE (STARTER) 0.5 MG X 11 & 1 MG X 42 PO TBPK
ORAL_TABLET | ORAL | 0 refills | Status: DC
  Filled 2023-12-15: qty 53, 30d supply, fill #0

## 2023-12-15 NOTE — Telephone Encounter (Signed)
 Called pt was advised and appt scheduled.

## 2023-12-16 ENCOUNTER — Other Ambulatory Visit (HOSPITAL_BASED_OUTPATIENT_CLINIC_OR_DEPARTMENT_OTHER): Payer: Self-pay

## 2023-12-16 ENCOUNTER — Other Ambulatory Visit: Payer: Self-pay

## 2023-12-16 MED ORDER — VARENICLINE TARTRATE (STARTER) 0.5 MG X 11 & 1 MG X 42 PO TBPK: ORAL_TABLET | ORAL | 0 refills | Status: DC

## 2023-12-27 ENCOUNTER — Ambulatory Visit: Admitting: Physician Assistant

## 2023-12-27 ENCOUNTER — Encounter: Payer: Self-pay | Admitting: Physician Assistant

## 2023-12-27 ENCOUNTER — Other Ambulatory Visit (HOSPITAL_BASED_OUTPATIENT_CLINIC_OR_DEPARTMENT_OTHER): Payer: Self-pay

## 2023-12-27 VITALS — BP 117/67 | HR 100 | Temp 98.2°F | Ht 73.0 in | Wt 208.8 lb

## 2023-12-27 DIAGNOSIS — J209 Acute bronchitis, unspecified: Secondary | ICD-10-CM

## 2023-12-27 DIAGNOSIS — Z87891 Personal history of nicotine dependence: Secondary | ICD-10-CM

## 2023-12-27 DIAGNOSIS — Z8701 Personal history of pneumonia (recurrent): Secondary | ICD-10-CM

## 2023-12-27 MED ORDER — ALBUTEROL SULFATE (2.5 MG/3ML) 0.083% IN NEBU
2.5000 mg | INHALATION_SOLUTION | Freq: Four times a day (QID) | RESPIRATORY_TRACT | 1 refills | Status: DC | PRN
Start: 2023-12-27 — End: 2024-01-25

## 2023-12-27 MED ORDER — BENZONATATE 100 MG PO CAPS
100.0000 mg | ORAL_CAPSULE | Freq: Two times a day (BID) | ORAL | 0 refills | Status: DC | PRN
Start: 2023-12-27 — End: 2024-01-25

## 2023-12-27 NOTE — Progress Notes (Signed)
 Established patient visit   Patient: Dale Hill   DOB: 1985/12/11   38 y.o. Male  MRN: [id number] Visit Date: 12/27/2023  Today's healthcare provider: Trenton Frock, PA-C   Cc. Cough, congestion, wheezing  Subjective     Pt reports cough, congestion, swollen glands, wheezing, some shortness of breath x 3-4 days. Reports history of frequent pneumonia, last episode in October of last year. He is concerned as this feels similar. He recently quit smoking.  Medications: Outpatient Medications Prior to Visit  Medication Sig   [START ON 01/14/2024] varenicline  (CHANTIX  CONTINUING MONTH PAK) 1 MG tablet Take 1 tablet (1 mg total) by mouth 2 (two) times daily.   Varenicline  Tartrate, Starter, (CHANTIX  STARTING MONTH PAK) 0.5 MG X 11 & 1 MG X 42 TBPK Follow instructions on package.   No facility-administered medications prior to visit.    Review of Systems  Constitutional:  Negative for fatigue and fever.  HENT:  Positive for congestion, ear pain, rhinorrhea, sinus pain and sore throat.   Respiratory:  Positive for cough, shortness of breath and wheezing.   Cardiovascular:  Negative for chest pain, palpitations and leg swelling.  Neurological:  Negative for dizziness and headaches.       Objective    BP 117/67   Pulse 100   Temp 98.2 F (36.8 C)   Ht 6\' 1"  (1.854 m)   Wt 208 lb 12.8 oz (94.7 kg)   SpO2 97%   BMI 27.55 kg/m    Physical Exam Constitutional:      General: He is awake.     Appearance: He is well-developed.  HENT:     Head: Normocephalic.     Right Ear: Tympanic membrane normal.     Left Ear: Tympanic membrane normal.     Mouth/Throat:     Pharynx: Posterior oropharyngeal erythema present. No oropharyngeal exudate.  Eyes:     Conjunctiva/sclera: Conjunctivae normal.  Cardiovascular:     Rate and Rhythm: Normal rate and regular rhythm.     Heart sounds: Normal heart sounds.  Pulmonary:     Effort: Pulmonary effort is normal.     Breath  sounds: Normal breath sounds. No wheezing, rhonchi or rales.  Skin:    General: Skin is warm.  Neurological:     Mental Status: He is alert and oriented to person, place, and time.  Psychiatric:        Attention and Perception: Attention normal.        Mood and Affect: Mood normal.        Speech: Speech normal.        Behavior: Behavior is cooperative.      No results found for any visits on 12/27/23.  Assessment & Plan    History of pneumonia -     Pulmonary Visit  Acute bronchitis, unspecified organism -     Albuterol  Sulfate; Take 3 mLs (2.5 mg total) by nebulization every 6 (six) hours as needed for wheezing or shortness of breath.  Dispense: 150 mL; Refill: 1 -     Benzonatate; Take 1 capsule (100 mg total) by mouth 2 (two) times daily as needed.  Dispense: 20 capsule; Refill: 0 -     Pulmonary Visit  History of tobacco use -     Pulmonary Visit   Rx albuterol  nebs q 6 hours  Rx tessalon, recommend otc antihistamines.  Lungs CTA today, if symptoms persist through the end of the week, would recommend chest xray.  Given repeated lower respiratory infections and smoking history, recommending pulm ref and pfts.   Return if symptoms worsen or fail to improve.       Trenton Frock, PA-C  [facility] [phone] (phone) [phone] (fax)  [facility]

## 2023-12-29 ENCOUNTER — Ambulatory Visit: Payer: Self-pay | Admitting: Family Medicine

## 2023-12-29 ENCOUNTER — Other Ambulatory Visit (INDEPENDENT_AMBULATORY_CARE_PROVIDER_SITE_OTHER): Payer: Self-pay

## 2023-12-29 DIAGNOSIS — E7800 Pure hypercholesterolemia, unspecified: Secondary | ICD-10-CM

## 2023-12-29 DIAGNOSIS — R748 Abnormal levels of other serum enzymes: Secondary | ICD-10-CM | POA: Diagnosis not present

## 2023-12-29 LAB — LIPID PANEL
Cholesterol: 208 mg/dL — ABNORMAL HIGH (ref 0–200)
HDL: 39 mg/dL — ABNORMAL LOW (ref >39.00)
LDL Cholesterol: 150 mg/dL — ABNORMAL HIGH (ref 0–99)
NonHDL: 169.06
Total CHOL/HDL Ratio: 5
Triglycerides: 97 mg/dL (ref 0.0–149.0)
VLDL: 19.4 mg/dL (ref 0.0–40.0)

## 2023-12-29 LAB — HEPATIC FUNCTION PANEL
ALT: 53 U/L (ref 0–53)
AST: 30 U/L (ref 0–37)
Albumin: 4.3 g/dL (ref 3.5–5.2)
Alkaline Phosphatase: 55 U/L (ref 39–117)
Bilirubin, Direct: 0.1 mg/dL (ref 0.0–0.3)
Total Bilirubin: 0.6 mg/dL (ref 0.2–1.2)
Total Protein: 6.7 g/dL (ref 6.0–8.3)

## 2024-01-10 ENCOUNTER — Encounter: Payer: Self-pay | Admitting: Family Medicine

## 2024-01-10 ENCOUNTER — Ambulatory Visit: Admitting: Family Medicine

## 2024-01-10 VITALS — BP 126/74 | HR 78 | Temp 98.0°F | Resp 16 | Ht 73.0 in | Wt 209.0 lb

## 2024-01-10 DIAGNOSIS — B9689 Other specified bacterial agents as the cause of diseases classified elsewhere: Secondary | ICD-10-CM

## 2024-01-10 DIAGNOSIS — J208 Acute bronchitis due to other specified organisms: Secondary | ICD-10-CM | POA: Diagnosis not present

## 2024-01-10 MED ORDER — AZITHROMYCIN 250 MG PO TABS: ORAL_TABLET | ORAL | 0 refills | Status: DC

## 2024-01-10 NOTE — Patient Instructions (Signed)
 Continue to push fluids, practice good hand hygiene, and cover your mouth if you cough.  If you start having fevers, shaking or shortness of breath, seek immediate care.  OK to take Tylenol  1000 mg (2 extra strength tabs) or 975 mg (3 regular strength tabs) every 6 hours as needed.  Send me a message on MyChart in 2-3 days if not significantly better.  Let us  know if you need anything.

## 2024-01-10 NOTE — Progress Notes (Signed)
 Chief Complaint  Patient presents with   Cough    Cough     Derald Flattery [place] here for URI complaints.  Duration: 2 weeks  Associated symptoms: shortness of breath and productive cough Denies: sinus congestion, sinus pain, rhinorrhea, itchy watery eyes, ear pain, ear drainage, sore throat, wheezing, myalgia, and fevers Treatment to date: Tessalon  Perles, SABA, Benadryl Sick contacts: No  Past Medical History:  Diagnosis Date   Cervical disc disorder     Objective BP 126/74 (BP Location: Left Arm, Patient Position: Sitting)   Pulse 78   Temp 98 F (36.7 C) (Oral)   Resp 16   Ht 6' 1 (1.854 m)   Wt 209 lb (94.8 kg)   SpO2 96%   BMI 27.57 kg/m  General: Awake, alert, appears stated age HEENT: AT, [place], ears patent b/l and TM's neg, nares patent w/o discharge, pharynx pink and without exudates, MMM, no sinus ttp Neck: No masses or asymmetry Heart: RRR Lungs: CTAB, no accessory muscle use Psych: Age appropriate judgment and insight, normal mood and affect  Acute bacterial bronchitis  Z-Pak.  If still no improvement in the next 2 to 3 days, will check an x-ray at the [place] location.  Continue to push fluids, practice good hand hygiene, cover mouth when coughing. F/u prn. If starting to experience fevers, shaking, or shortness of breath, seek immediate care. Pt voiced understanding and agreement to the plan.  Shellie Dials [place], DO 01/10/24 10:58 AM

## 2024-01-18 ENCOUNTER — Ambulatory Visit: Payer: Self-pay | Admitting: Family Medicine

## 2024-01-25 ENCOUNTER — Encounter: Payer: Self-pay | Admitting: Family Medicine

## 2024-01-25 ENCOUNTER — Ambulatory Visit: Admitting: Family Medicine

## 2024-01-25 VITALS — BP 132/82 | HR 96 | Temp 98.0°F | Resp 16 | Ht 73.0 in | Wt 205.0 lb

## 2024-01-25 DIAGNOSIS — Z720 Tobacco use: Secondary | ICD-10-CM

## 2024-01-25 MED ORDER — VARENICLINE TARTRATE 1 MG PO TABS: 1.0000 mg | ORAL_TABLET | Freq: Two times a day (BID) | ORAL | 1 refills | Status: DC

## 2024-01-25 NOTE — Patient Instructions (Signed)
 Great work with cutting down on smoking.   Let us  know if you need anything.

## 2024-01-25 NOTE — Progress Notes (Signed)
 Chief Complaint  Patient presents with   Follow-up    Follow Up    Subjective: Patient is a 38 y.o. male here for f/u smoking.  Smoking 1/2 ppd. Down from 1 ppd. He is currently on Chantix  1 mg bid. Compliant, no AE's. Feels he is breathing better. No signs of emphysema on imaging in the past. Seeing pulm soon to consider COPD dx.   Past Medical History:  Diagnosis Date   Cervical disc disorder     Objective: BP 132/82 (BP Location: Left Arm, Patient Position: Sitting)   Pulse 96   Temp 98 F (36.7 C) (Oral)   Resp 16   Ht 6' 1 (1.854 m)   Wt 205 lb (93 kg)   SpO2 100%   BMI 27.05 kg/m  General: Awake, appears stated age Heart: RRR, no LE edema Lungs: CTAB, no rales, wheezes or rhonchi. No accessory muscle use Psych: Age appropriate judgment and insight, normal affect and mood  Assessment and Plan: Tobacco abuse - Plan: varenicline  (CHANTIX  CONTINUING MONTH PAK) 1 MG tablet  Chronic, improving. Cont Chantix  for now. He is down with his smoking. Stress at work preventing more reduction at this time. He will let me know if he would like a refill beyond the next 60 d.  F/u in 11 mo for a CPE or prn.  The patient voiced understanding and agreement to the plan.  Mabel Mt [place], DO 01/25/24  10:18 AM

## 2024-03-21 ENCOUNTER — Ambulatory Visit: Admitting: Pulmonary Disease

## 2024-03-21 ENCOUNTER — Encounter: Payer: Self-pay | Admitting: Pulmonary Disease

## 2024-04-27 DIAGNOSIS — R051 Acute cough: Secondary | ICD-10-CM | POA: Diagnosis not present

## 2024-04-27 DIAGNOSIS — R0981 Nasal congestion: Secondary | ICD-10-CM | POA: Diagnosis not present

## 2024-04-27 DIAGNOSIS — R5381 Other malaise: Secondary | ICD-10-CM | POA: Diagnosis not present

## 2024-05-09 ENCOUNTER — Ambulatory Visit: Admitting: Family Medicine

## 2024-05-09 ENCOUNTER — Encounter: Payer: Self-pay | Admitting: Family Medicine

## 2024-05-09 VITALS — BP 130/78 | HR 100 | Temp 97.8°F | Resp 16 | Ht 73.0 in | Wt 201.0 lb

## 2024-05-09 DIAGNOSIS — Z720 Tobacco use: Secondary | ICD-10-CM | POA: Diagnosis not present

## 2024-05-09 DIAGNOSIS — J40 Bronchitis, not specified as acute or chronic: Secondary | ICD-10-CM | POA: Diagnosis not present

## 2024-05-09 MED ORDER — VARENICLINE TARTRATE (STARTER) 0.5 MG X 11 & 1 MG X 42 PO TBPK: ORAL_TABLET | ORAL | 0 refills | Status: AC

## 2024-05-09 MED ORDER — PREDNISONE 20 MG PO TABS
40.0000 mg | ORAL_TABLET | Freq: Every day | ORAL | 0 refills | Status: AC
Start: 2024-05-09 — End: 2024-05-14

## 2024-05-09 MED ORDER — VARENICLINE TARTRATE (STARTER) 0.5 MG X 11 & 1 MG X 42 PO TBPK: ORAL_TABLET | ORAL | 0 refills | Status: DC

## 2024-05-09 MED ORDER — VARENICLINE TARTRATE 1 MG PO TABS
1.0000 mg | ORAL_TABLET | Freq: Two times a day (BID) | ORAL | 1 refills | Status: AC
Start: 2024-06-09 — End: ?

## 2024-05-09 NOTE — Patient Instructions (Signed)
 Continue to push fluids, practice good hand hygiene, and cover your mouth if you cough.  If you start having fevers, shaking or shortness of breath, seek immediate care.  OK to take Tylenol  1000 mg (2 extra strength tabs) or 975 mg (3 regular strength tabs) every 6 hours as needed.  Send me a message in 2-3 days if not significantly better.   Let us  know if you need anything.

## 2024-05-09 NOTE — Progress Notes (Signed)
 Chief Complaint  Patient presents with   Follow-up    Follow Up     Dale Hill here for URI complaints.  Duration: 2 weeks  Associated symptoms: sinus congestion, rhinorrhea, ear fullness, ear drainage, wheezing, and coughing Denies: sinus pain, itchy watery eyes, ear pain, sore throat, shortness of breath, myalgia, and fevers Treatment to date: INCS, cough syrup Sick contacts: Yes- daughter  Past Medical History:  Diagnosis Date   Cervical disc disorder     Objective BP 130/78 (BP Location: Left Arm, Patient Position: Sitting)   Pulse 100   Temp 97.8 F (36.6 C) (Oral)   Resp 16   Ht 6' 1 (1.854 m)   Wt 201 lb (91.2 kg)   SpO2 97%   BMI 26.52 kg/m  General: Awake, alert, appears stated age HEENT: AT, [place], ears patent b/l and TM's neg, nares patent w/o discharge, pharynx pink and without exudates, MMM, no sinus TTP bilaterally Neck: No masses or asymmetry Heart: RRR Lungs: Diffuse expiratory wheezing, no accessory muscle use Psych: Age appropriate judgment and insight, normal mood and affect  Wheezy bronchitis - Plan: predniSONE  (DELTASONE ) 20 MG tablet  Tobacco abuse - Plan: varenicline  (CHANTIX  CONTINUING MONTH PAK) 1 MG tablet  5-day prednisone  burst 40 mg daily.  Continue to push fluids, practice good hand hygiene, cover mouth when coughing.  Send message in 2-3 days if no significant improvement. F/u prn. If starting to experience fevers, shaking, or shortness of breath, seek immediate care. Pt voiced understanding and agreement to the plan.  Mabel Mt [place], DO 05/09/24 4:16 PM

## 2024-05-15 ENCOUNTER — Other Ambulatory Visit: Payer: Self-pay | Admitting: Family Medicine

## 2024-05-15 ENCOUNTER — Encounter: Payer: Self-pay | Admitting: Family Medicine

## 2024-05-15 MED ORDER — DOXYCYCLINE HYCLATE 100 MG PO TABS: 100.0000 mg | ORAL_TABLET | Freq: Two times a day (BID) | ORAL | 0 refills | Status: AC

## 2024-05-19 DIAGNOSIS — J209 Acute bronchitis, unspecified: Secondary | ICD-10-CM | POA: Diagnosis not present

## 2024-05-19 DIAGNOSIS — K208 Other esophagitis without bleeding: Secondary | ICD-10-CM | POA: Diagnosis not present

## 2024-05-19 DIAGNOSIS — R079 Chest pain, unspecified: Secondary | ICD-10-CM | POA: Diagnosis not present

## 2024-05-19 DIAGNOSIS — T364X5A Adverse effect of tetracyclines, initial encounter: Secondary | ICD-10-CM | POA: Diagnosis not present

## 2024-06-05 ENCOUNTER — Ambulatory Visit: Admitting: Pulmonary Disease

## 2024-07-15 ENCOUNTER — Telehealth: Admitting: Physician Assistant

## 2024-07-15 DIAGNOSIS — K047 Periapical abscess without sinus: Secondary | ICD-10-CM

## 2024-07-15 DIAGNOSIS — K0889 Other specified disorders of teeth and supporting structures: Secondary | ICD-10-CM

## 2024-07-15 MED ORDER — NAPROXEN 500 MG PO TABS: 500.0000 mg | ORAL_TABLET | Freq: Two times a day (BID) | ORAL | 0 refills | Status: AC

## 2024-07-15 MED ORDER — AMOXICILLIN-POT CLAVULANATE 875-125 MG PO TABS: 1.0000 | ORAL_TABLET | Freq: Two times a day (BID) | ORAL | 0 refills | Status: AC

## 2024-07-15 NOTE — Patient Instructions (Signed)
" °  Dale Hill, thank you for joining [name], PA-C for today's virtual visit.  While this provider is not your primary care provider (PCP), if your PCP is located in our provider database this encounter information will be shared with them immediately following your visit.   A [place] MyChart account gives you access to today's visit and all your visits, tests, and labs performed at [facility]  click here if you don't have a [place] MyChart account or go to [email or web address]  Consent: (Patient) Dale Hill provided verbal consent for this virtual visit at the beginning of the encounter.  Current Medications:  Current Outpatient Medications:    varenicline  (CHANTIX  CONTINUING MONTH PAK) 1 MG tablet, Take 1 tablet (1 mg total) by mouth 2 (two) times daily., Disp: 60 tablet, Rfl: 1   Medications ordered in this encounter:  No orders of the defined types were placed in this encounter.    *If you need refills on other medications prior to your next appointment, please contact your pharmacy*  Follow-Up: Call back or seek an in-person evaluation if the symptoms worsen or if the condition fails to improve as anticipated.  [facility] [phone]  Other Instructions Follow up with primary provider in 24-48 hours. Report to nearest ER with any worsening symptoms.    If you have been instructed to have an in-person evaluation today at a local Urgent Care facility, please use the link below. It will take you to a list of all of our available [place] [facility], including address, phone number and hours of operation. Please do not delay care.  [facility]  If you or a family member do not have a primary care provider, use the link below to schedule a visit and establish care. When you choose a [place] primary care physician or advanced practice provider, you gain a long-term partner in health. Find a Primary  Care Provider  Learn more about [place]'s in-office and virtual care options: [place] - Get Care Now  "

## 2024-07-15 NOTE — Progress Notes (Signed)
 " Virtual Visit Consent   Dale Hill, you are scheduled for a virtual visit with a [facility] provider today. Just as with appointments in the office, your consent must be obtained to participate. Your consent will be active for this visit and any virtual visit you may have with one of our providers in the next 365 days. If you have a MyChart account, a copy of this consent can be sent to you electronically.  As this is a virtual visit, video technology does not allow for your provider to perform a traditional examination. This may limit your provider's ability to fully assess your condition. If your provider identifies any concerns that need to be evaluated in person or the need to arrange testing (such as labs, EKG, etc.), we will make arrangements to do so. Although advances in technology are sophisticated, we cannot ensure that it will always work on either your end or our end. If the connection with a video visit is poor, the visit may have to be switched to a telephone visit. With either a video or telephone visit, we are not always able to ensure that we have a secure connection.  By engaging in this virtual visit, you consent to the provision of healthcare and authorize for your insurance to be billed (if applicable) for the services provided during this visit. Depending on your insurance coverage, you may receive a charge related to this service.  I need to obtain your verbal consent now. Are you willing to proceed with your visit today? Dale Hill has provided verbal consent on 07/15/2024 for a virtual visit (video or telephone). Teena Hill, [place]  Date: 07/15/2024 3:37 PM   Virtual Visit via Video Note   I, Dale Hill, connected with  Dale Hill  ([id number], 1986/07/08) on 07/15/2024 at  3:30 PM EST by a video-enabled telemedicine application and verified that I am speaking with the correct person using two identifiers.  Location: Patient: Virtual Visit  Location Patient: Home Provider: Virtual Visit Location Provider: Home Office   I discussed the limitations of evaluation and management by telemedicine and the availability of in person appointments. The patient expressed understanding and agreed to proceed.    History of Present Illness: Dale Hill is a 38 y.o. who identifies as a male who was assigned male at birth, and is being seen today for dental pain.  HPI: Dental Pain  This is a new problem. The current episode started in the past 7 days. The problem occurs constantly. The pain is at a severity of 3/10. The pain is mild. Pertinent negatives include no difficulty swallowing, facial pain, fever, oral bleeding, sinus pressure or thermal sensitivity. He has tried NSAIDs and acetaminophen  for the symptoms. The treatment provided mild relief.    Problems:  Patient Active Problem List   Diagnosis Date Noted   Tobacco abuse 12/14/2023    Allergies: Allergies[1] Medications: Current Medications[2]  Observations/Objective: Patient is well-developed, well-nourished in no acute distress.  Resting comfortably  at home.  Head is normocephalic, atraumatic.  No labored breathing.  Speech is clear and coherent with logical content.  Patient is alert and oriented at baseline.  Left lower molar - no drainage mild discoloration.   Assessment and Plan: 1. Dental infection (Primary)  Patients' presentation consistent with dental pain with possible infection. No evidence of Ludwig's Angina, large abscess pocket, requirement for emergent extraction, or other complications. Provided prescription for Augmentin  and Ibuprofen . Patient informed to follow up with local  dentist. Report to ER if pain uncontrolled, abscess that drains purulent fluid, high fevers, trouble swallowing, or other concerns. Expressed understanding of and agreement with plan and all questions answered.  Follow Up Instructions: I discussed the assessment and treatment plan  with the patient. The patient was provided an opportunity to ask questions and all were answered. The patient agreed with the plan and demonstrated an understanding of the instructions.  A copy of instructions were sent to the patient via MyChart unless otherwise noted below.    The patient was advised to call back or seek an in-person evaluation if the symptoms worsen or if the condition fails to improve as anticipated.    [name], PA-C    [1]  Allergies Allergen Reactions   Septra [Sulfamethoxazole-Trimethoprim]     unknown   Septra [Sulfamethoxazole-Trimethoprim] Other (See Comments)    Childhood Allergy  [2]  Current Outpatient Medications:    varenicline  (CHANTIX  CONTINUING MONTH PAK) 1 MG tablet, Take 1 tablet (1 mg total) by mouth 2 (two) times daily., Disp: 60 tablet, Rfl: 1  "

## 2024-08-03 ENCOUNTER — Encounter: Payer: Self-pay | Admitting: Family Medicine

## 2024-12-13 ENCOUNTER — Encounter: Payer: Self-pay | Admitting: Family Medicine
